# Patient Record
Sex: Male | Born: 1935
Health system: Southern US, Community
[De-identification: ages and names within clinical notes are randomized; demographics above are authoritative.]

## PROBLEM LIST (undated history)

## (undated) DIAGNOSIS — T7840XA Allergy, unspecified, initial encounter: Secondary | ICD-10-CM

## (undated) DIAGNOSIS — K648 Other hemorrhoids: Secondary | ICD-10-CM

## (undated) DIAGNOSIS — E663 Overweight: Secondary | ICD-10-CM

## (undated) DIAGNOSIS — M5030 Other cervical disc degeneration, unspecified cervical region: Secondary | ICD-10-CM

## (undated) DIAGNOSIS — N281 Cyst of kidney, acquired: Secondary | ICD-10-CM

## (undated) DIAGNOSIS — K8020 Calculus of gallbladder without cholecystitis without obstruction: Secondary | ICD-10-CM

## (undated) DIAGNOSIS — Z8612 Personal history of poliomyelitis: Secondary | ICD-10-CM

## (undated) DIAGNOSIS — M5137 Other intervertebral disc degeneration, lumbosacral region: Secondary | ICD-10-CM

## (undated) DIAGNOSIS — Z86010 Personal history of colonic polyps: Secondary | ICD-10-CM

## (undated) DIAGNOSIS — I10 Essential (primary) hypertension: Secondary | ICD-10-CM

## (undated) DIAGNOSIS — Z87898 Personal history of other specified conditions: Secondary | ICD-10-CM

## (undated) HISTORY — DX: Cyst of kidney, acquired: N28.1

## (undated) HISTORY — DX: Essential (primary) hypertension: I10

## (undated) HISTORY — DX: Other hemorrhoids: K64.8

## (undated) HISTORY — DX: Overweight: E66.3

## (undated) HISTORY — DX: Other cervical disc degeneration, unspecified cervical region: M50.30

## (undated) HISTORY — DX: Other intervertebral disc degeneration, lumbosacral region: M51.37

## (undated) HISTORY — DX: Calculus of gallbladder without cholecystitis without obstruction: K80.20

## (undated) HISTORY — DX: Personal history of colonic polyps: Z86.010

## (undated) HISTORY — PX: OTHER SURGICAL HISTORY: SHX169

## (undated) HISTORY — DX: Personal history of poliomyelitis: Z86.12

## (undated) HISTORY — PX: TONSILLECTOMY: SUR1361

## (undated) HISTORY — DX: Personal history of other specified conditions: Z87.898

## (undated) HISTORY — DX: Allergy, unspecified, initial encounter: T78.40XA

## (undated) HISTORY — PX: HEMORRHOID SURGERY: SHX153

## (undated) HISTORY — PX: CERVICAL FUSION: SHX112

---

## 2001-11-18 ENCOUNTER — Encounter: Payer: Self-pay | Admitting: Emergency Medicine

## 2001-11-18 ENCOUNTER — Inpatient Hospital Stay (HOSPITAL_COMMUNITY): Admission: AC | Admit: 2001-11-18 | Discharge: 2001-11-19 | Payer: Self-pay

## 2002-04-22 ENCOUNTER — Inpatient Hospital Stay (HOSPITAL_COMMUNITY): Admission: RE | Admit: 2002-04-22 | Discharge: 2002-04-24 | Payer: Self-pay | Admitting: Neurosurgery

## 2002-04-22 ENCOUNTER — Encounter: Payer: Self-pay | Admitting: Neurosurgery

## 2003-06-15 ENCOUNTER — Encounter (INDEPENDENT_AMBULATORY_CARE_PROVIDER_SITE_OTHER): Payer: Self-pay | Admitting: Gastroenterology

## 2004-11-19 ENCOUNTER — Emergency Department (HOSPITAL_COMMUNITY): Admission: EM | Admit: 2004-11-19 | Discharge: 2004-11-19 | Payer: Self-pay | Admitting: Emergency Medicine

## 2005-01-08 ENCOUNTER — Ambulatory Visit (HOSPITAL_BASED_OUTPATIENT_CLINIC_OR_DEPARTMENT_OTHER): Admission: RE | Admit: 2005-01-08 | Discharge: 2005-01-08 | Payer: Self-pay | Admitting: Orthopedic Surgery

## 2005-01-08 ENCOUNTER — Ambulatory Visit (HOSPITAL_COMMUNITY): Admission: RE | Admit: 2005-01-08 | Discharge: 2005-01-08 | Payer: Self-pay | Admitting: Orthopedic Surgery

## 2006-01-20 ENCOUNTER — Emergency Department (HOSPITAL_COMMUNITY): Admission: EM | Admit: 2006-01-20 | Discharge: 2006-01-20 | Payer: Self-pay | Admitting: Emergency Medicine

## 2006-06-19 ENCOUNTER — Encounter: Admission: RE | Admit: 2006-06-19 | Discharge: 2006-06-19 | Payer: Self-pay | Admitting: Neurosurgery

## 2006-06-25 ENCOUNTER — Ambulatory Visit (HOSPITAL_COMMUNITY): Admission: RE | Admit: 2006-06-25 | Discharge: 2006-06-25 | Payer: Self-pay | Admitting: Neurosurgery

## 2006-07-25 ENCOUNTER — Inpatient Hospital Stay (HOSPITAL_COMMUNITY): Admission: RE | Admit: 2006-07-25 | Discharge: 2006-07-26 | Payer: Self-pay | Admitting: Neurosurgery

## 2006-09-23 ENCOUNTER — Ambulatory Visit: Payer: Self-pay | Admitting: Internal Medicine

## 2006-09-24 LAB — CONVERTED CEMR LAB
AST: 30 units/L (ref 0–37)
Albumin: 3.6 g/dL (ref 3.5–5.2)
BUN: 20 mg/dL (ref 6–23)
CO2: 32 meq/L (ref 19–32)
Calcium: 9.7 mg/dL (ref 8.4–10.5)
Cholesterol: 207 mg/dL (ref 0–200)
Creatinine, Ser: 1.3 mg/dL (ref 0.4–1.5)
Direct LDL: 119.5 mg/dL
GFR calc Af Amer: 70 mL/min
Glucose, Bld: 103 mg/dL — ABNORMAL HIGH (ref 70–99)
PSA: 1.01 ng/mL (ref 0.10–4.00)
Potassium: 3.5 meq/L (ref 3.5–5.1)
Sodium: 145 meq/L (ref 135–145)
Total Bilirubin: 1.1 mg/dL (ref 0.3–1.2)
Triglycerides: 71 mg/dL (ref 0–149)

## 2007-03-17 ENCOUNTER — Inpatient Hospital Stay (HOSPITAL_COMMUNITY): Admission: RE | Admit: 2007-03-17 | Discharge: 2007-03-19 | Payer: Self-pay | Admitting: Neurosurgery

## 2007-07-21 ENCOUNTER — Encounter: Admission: RE | Admit: 2007-07-21 | Discharge: 2007-07-21 | Payer: Self-pay | Admitting: Neurosurgery

## 2007-11-29 ENCOUNTER — Observation Stay (HOSPITAL_COMMUNITY): Admission: EM | Admit: 2007-11-29 | Discharge: 2007-11-30 | Payer: Self-pay | Admitting: Emergency Medicine

## 2007-11-29 ENCOUNTER — Ambulatory Visit: Payer: Self-pay | Admitting: *Deleted

## 2007-12-02 ENCOUNTER — Ambulatory Visit: Payer: Self-pay | Admitting: Cardiology

## 2007-12-02 ENCOUNTER — Encounter: Payer: Self-pay | Admitting: Internal Medicine

## 2007-12-02 ENCOUNTER — Ambulatory Visit: Payer: Self-pay

## 2007-12-02 LAB — CONVERTED CEMR LAB
GFR calc Af Amer: 77 mL/min
Glucose, Bld: 98 mg/dL (ref 70–99)

## 2007-12-03 ENCOUNTER — Ambulatory Visit (HOSPITAL_COMMUNITY): Admission: RE | Admit: 2007-12-03 | Discharge: 2007-12-03 | Payer: Self-pay | Admitting: Cardiology

## 2007-12-16 ENCOUNTER — Ambulatory Visit: Payer: Self-pay | Admitting: Cardiology

## 2007-12-17 DIAGNOSIS — Z87898 Personal history of other specified conditions: Secondary | ICD-10-CM

## 2007-12-17 DIAGNOSIS — N281 Cyst of kidney, acquired: Secondary | ICD-10-CM

## 2007-12-17 DIAGNOSIS — Z860100 Personal history of colon polyps, unspecified: Secondary | ICD-10-CM

## 2007-12-17 DIAGNOSIS — I10 Essential (primary) hypertension: Secondary | ICD-10-CM | POA: Insufficient documentation

## 2007-12-17 DIAGNOSIS — Z8612 Personal history of poliomyelitis: Secondary | ICD-10-CM | POA: Insufficient documentation

## 2007-12-17 DIAGNOSIS — K8020 Calculus of gallbladder without cholecystitis without obstruction: Secondary | ICD-10-CM | POA: Insufficient documentation

## 2007-12-17 DIAGNOSIS — M5137 Other intervertebral disc degeneration, lumbosacral region: Secondary | ICD-10-CM

## 2007-12-17 DIAGNOSIS — K648 Other hemorrhoids: Secondary | ICD-10-CM | POA: Insufficient documentation

## 2007-12-17 DIAGNOSIS — M51379 Other intervertebral disc degeneration, lumbosacral region without mention of lumbar back pain or lower extremity pain: Secondary | ICD-10-CM

## 2007-12-17 HISTORY — DX: Other hemorrhoids: K64.8

## 2007-12-17 HISTORY — DX: Personal history of other specified conditions: Z87.898

## 2007-12-17 HISTORY — DX: Cyst of kidney, acquired: N28.1

## 2007-12-17 HISTORY — DX: Other intervertebral disc degeneration, lumbosacral region without mention of lumbar back pain or lower extremity pain: M51.379

## 2007-12-17 HISTORY — DX: Personal history of colon polyps, unspecified: Z86.0100

## 2007-12-17 HISTORY — DX: Calculus of gallbladder without cholecystitis without obstruction: K80.20

## 2007-12-17 HISTORY — DX: Other intervertebral disc degeneration, lumbosacral region: M51.37

## 2007-12-17 HISTORY — DX: Essential (primary) hypertension: I10

## 2007-12-17 HISTORY — DX: Personal history of colonic polyps: Z86.010

## 2007-12-17 HISTORY — DX: Personal history of poliomyelitis: Z86.12

## 2007-12-19 ENCOUNTER — Ambulatory Visit: Payer: Self-pay | Admitting: Internal Medicine

## 2008-01-05 ENCOUNTER — Emergency Department (HOSPITAL_COMMUNITY): Admission: EM | Admit: 2008-01-05 | Discharge: 2008-01-06 | Payer: Self-pay | Admitting: Emergency Medicine

## 2008-01-11 ENCOUNTER — Emergency Department (HOSPITAL_COMMUNITY): Admission: EM | Admit: 2008-01-11 | Discharge: 2008-01-11 | Payer: Self-pay | Admitting: Emergency Medicine

## 2008-01-26 ENCOUNTER — Telehealth: Payer: Self-pay | Admitting: Internal Medicine

## 2008-02-18 ENCOUNTER — Ambulatory Visit: Payer: Self-pay | Admitting: Internal Medicine

## 2008-02-19 DIAGNOSIS — E663 Overweight: Secondary | ICD-10-CM

## 2008-02-19 DIAGNOSIS — M5030 Other cervical disc degeneration, unspecified cervical region: Secondary | ICD-10-CM | POA: Insufficient documentation

## 2008-02-19 HISTORY — DX: Other cervical disc degeneration, unspecified cervical region: M50.30

## 2008-02-19 HISTORY — DX: Overweight: E66.3

## 2008-05-13 ENCOUNTER — Encounter (INDEPENDENT_AMBULATORY_CARE_PROVIDER_SITE_OTHER): Payer: Self-pay | Admitting: *Deleted

## 2008-05-13 ENCOUNTER — Telehealth (INDEPENDENT_AMBULATORY_CARE_PROVIDER_SITE_OTHER): Payer: Self-pay | Admitting: *Deleted

## 2008-05-18 ENCOUNTER — Encounter: Admission: RE | Admit: 2008-05-18 | Discharge: 2008-05-18 | Payer: Self-pay | Admitting: Neurosurgery

## 2008-10-29 ENCOUNTER — Encounter: Payer: Self-pay | Admitting: Internal Medicine

## 2008-11-01 IMAGING — CR DG LUMBAR SPINE 1V
1 series · 1 of 1 positions shown · non-contrast
Comparison: 07/25/06.

CLINICAL DATA: Lumbar stenosis ? localization for decompression.  
 LUMBAR SPINE ? 1 VIEW:

[view not recorded]
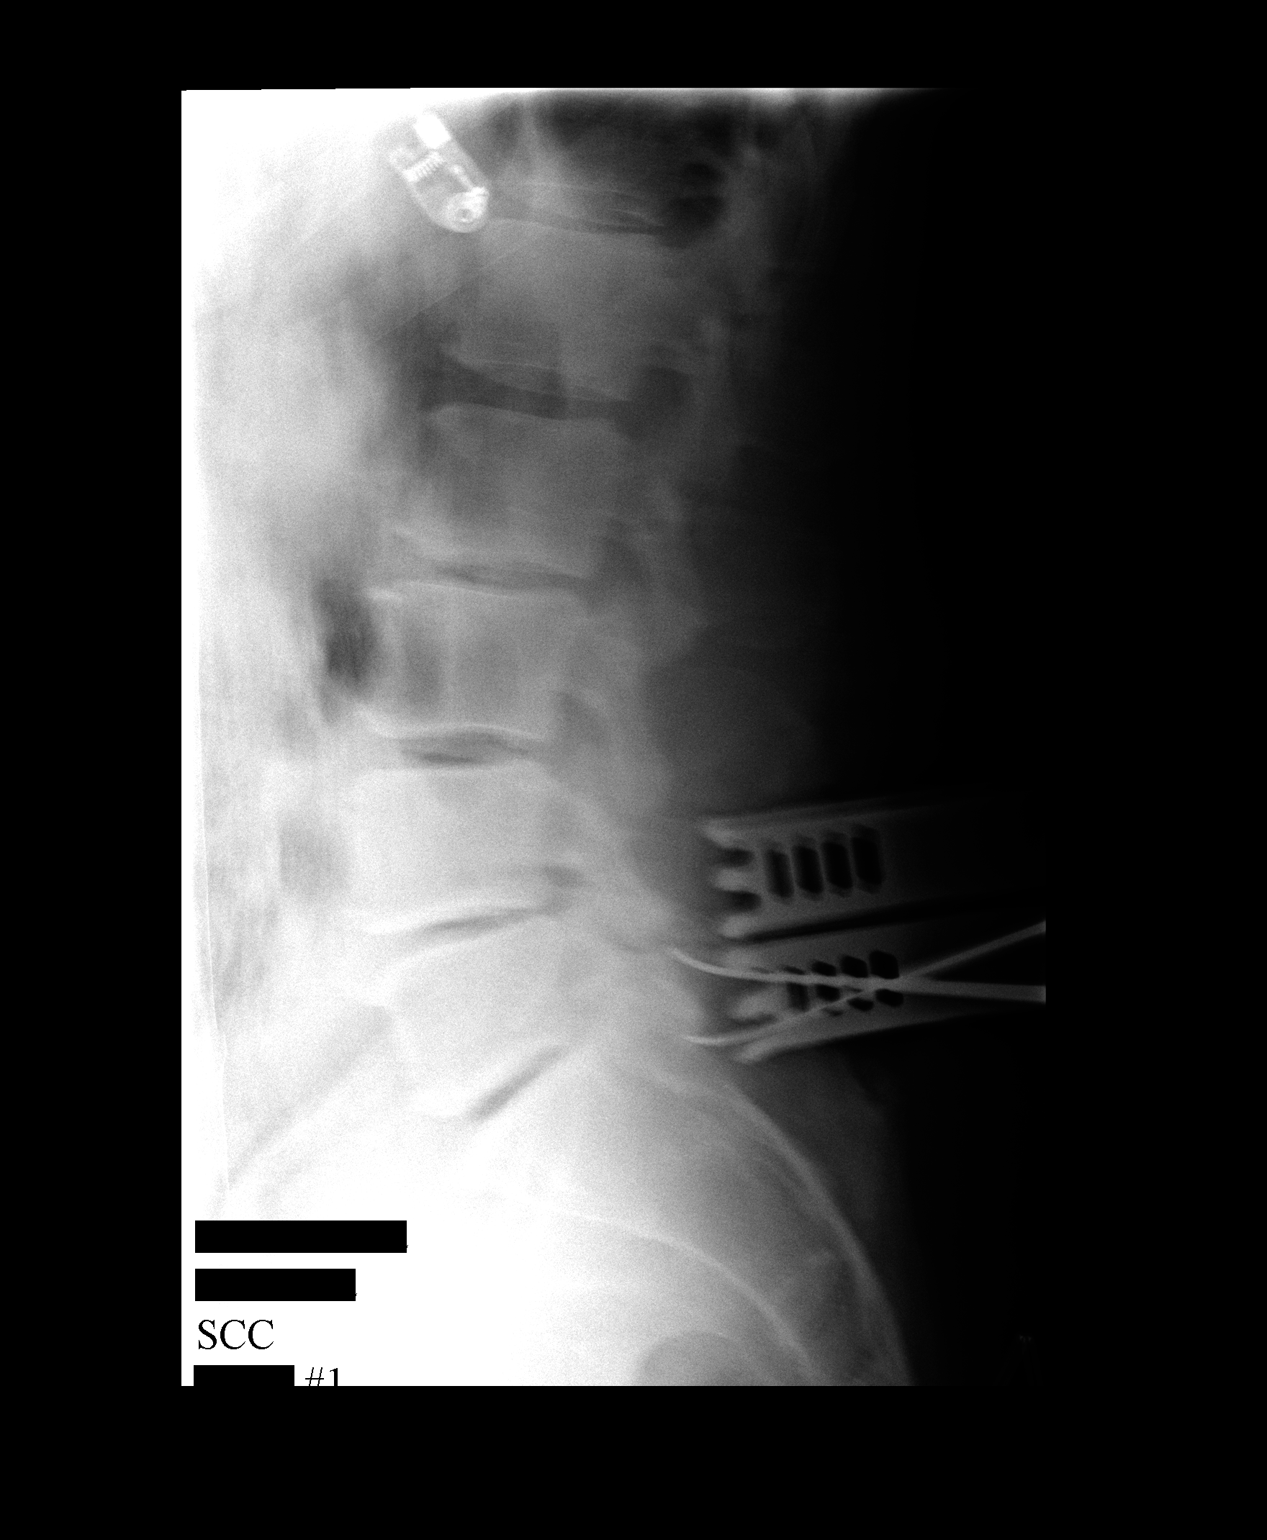

[1 of 1 positions shown; findings below may reference images not displayed]

FINDINGS: Lateral view from the operating room shows instruments posterior to the neural canal at the level of the L5-S1 disc space, and at the level of the L5 pedicles.  There is disc narrowing at L4-5 and L5-S1.
IMPRESSION: Spinal localization as above.

## 2008-11-01 IMAGING — RF DG LUMBAR SPINE 2-3V
1 series · 2 of 2 positions shown · non-contrast
Comparison: none

CLINICAL DATA: 71-year-old with lumbar stenosis.  Decompressive laminectomy and fusion. 
 INTRAOPERATIVE SPOT VIEWS OF LUMBAR SPINE ? 2 VIEW:

[Series 1: run · 2 of 2 slices shown]
[im 1/2]
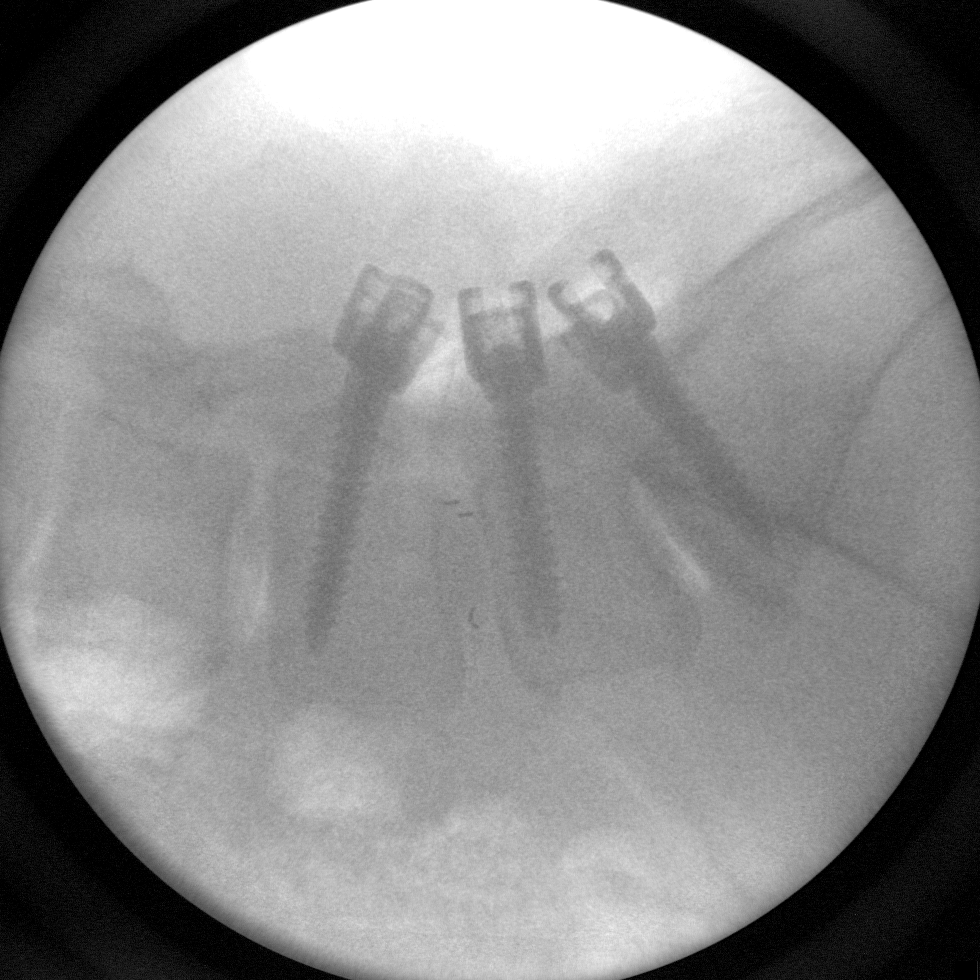
[im 2/2]
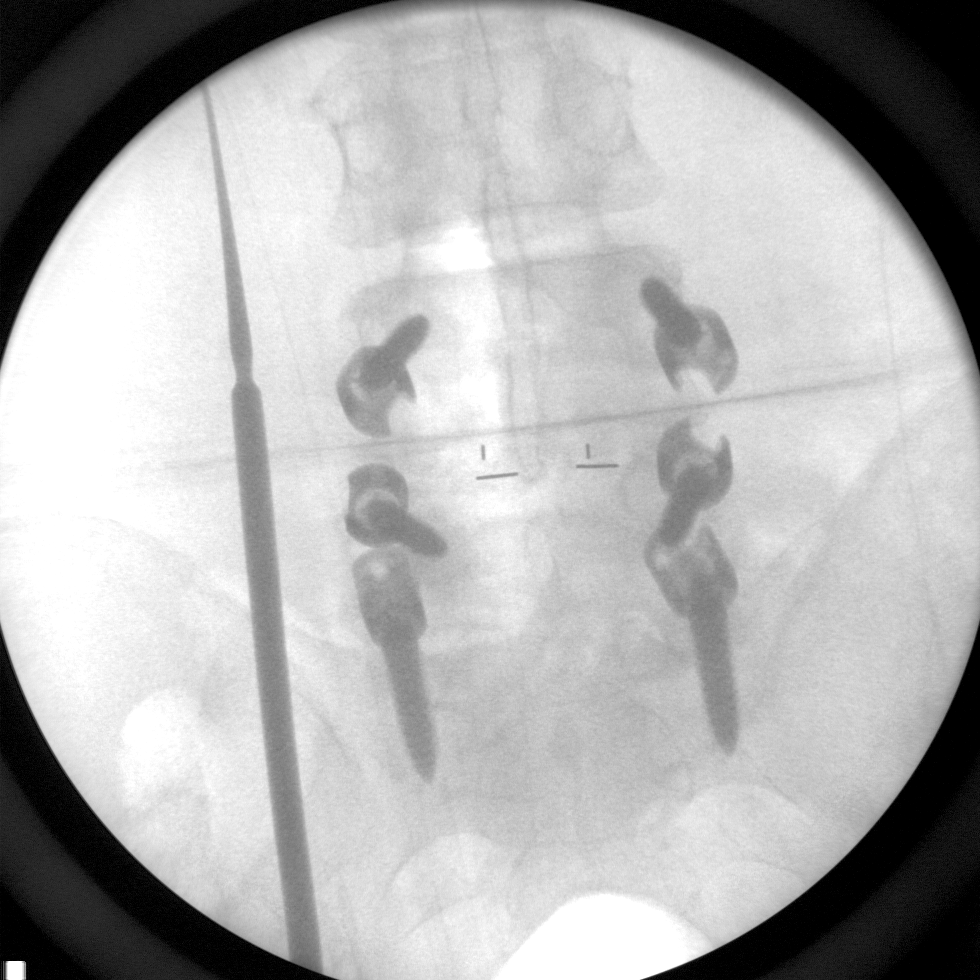

[2 of 2 positions shown; findings below may reference images not displayed]

FINDINGS: Pedicle screws are in place along with an interbody bone spacer at L4-5.  Alignment is normal.  No complicating features demonstrated.
IMPRESSION: Pedicle screws in L4, L5, and S1 along with interbody bone spacer at L4-5.

## 2008-12-25 ENCOUNTER — Encounter: Admission: RE | Admit: 2008-12-25 | Discharge: 2008-12-25 | Payer: Self-pay | Admitting: Neurosurgery

## 2009-03-07 ENCOUNTER — Ambulatory Visit: Payer: Self-pay | Admitting: Internal Medicine

## 2009-03-07 LAB — CONVERTED CEMR LAB
ALT: 38 units/L (ref 0–53)
AST: 33 units/L (ref 0–37)
Alkaline Phosphatase: 73 units/L (ref 39–117)
Basophils Absolute: 0.1 10*3/uL (ref 0.0–0.1)
Basophils Relative: 0.8 % (ref 0.0–3.0)
CO2: 34 meq/L — ABNORMAL HIGH (ref 19–32)
Creatinine, Ser: 1.2 mg/dL (ref 0.4–1.5)
Eosinophils Absolute: 0.1 10*3/uL (ref 0.0–0.7)
Lymphs Abs: 1.8 10*3/uL (ref 0.7–4.0)
MCV: 96.8 fL (ref 78.0–100.0)
Monocytes Absolute: 0.6 10*3/uL (ref 0.1–1.0)
Monocytes Relative: 7 % (ref 3.0–12.0)
Neutro Abs: 5.7 10*3/uL (ref 1.4–7.7)
Neutrophils Relative %: 69.1 % (ref 43.0–77.0)
Potassium: 3.3 meq/L — ABNORMAL LOW (ref 3.5–5.1)
RBC: 4.88 M/uL (ref 4.22–5.81)
RDW: 12.4 % (ref 11.5–14.6)
Total Bilirubin: 1.2 mg/dL (ref 0.3–1.2)
Total CHOL/HDL Ratio: 4
Total Protein: 8.1 g/dL (ref 6.0–8.3)
VLDL: 14.6 mg/dL (ref 0.0–40.0)

## 2009-03-09 ENCOUNTER — Encounter: Payer: Self-pay | Admitting: Internal Medicine

## 2009-03-10 ENCOUNTER — Telehealth: Payer: Self-pay | Admitting: Internal Medicine

## 2009-05-23 ENCOUNTER — Encounter (INDEPENDENT_AMBULATORY_CARE_PROVIDER_SITE_OTHER): Payer: Self-pay | Admitting: *Deleted

## 2009-05-24 ENCOUNTER — Ambulatory Visit: Payer: Self-pay | Admitting: Internal Medicine

## 2009-06-07 ENCOUNTER — Ambulatory Visit: Payer: Self-pay | Admitting: Internal Medicine

## 2009-06-14 ENCOUNTER — Encounter: Payer: Self-pay | Admitting: Internal Medicine

## 2009-08-22 IMAGING — CR DG CHEST 2V
2 series · 2 of 2 positions shown · non-contrast
Comparison: Portable chest 11/29/2007.

CLINICAL DATA: Fall, head injury

CHEST - 2 VIEW

[w chest pa]
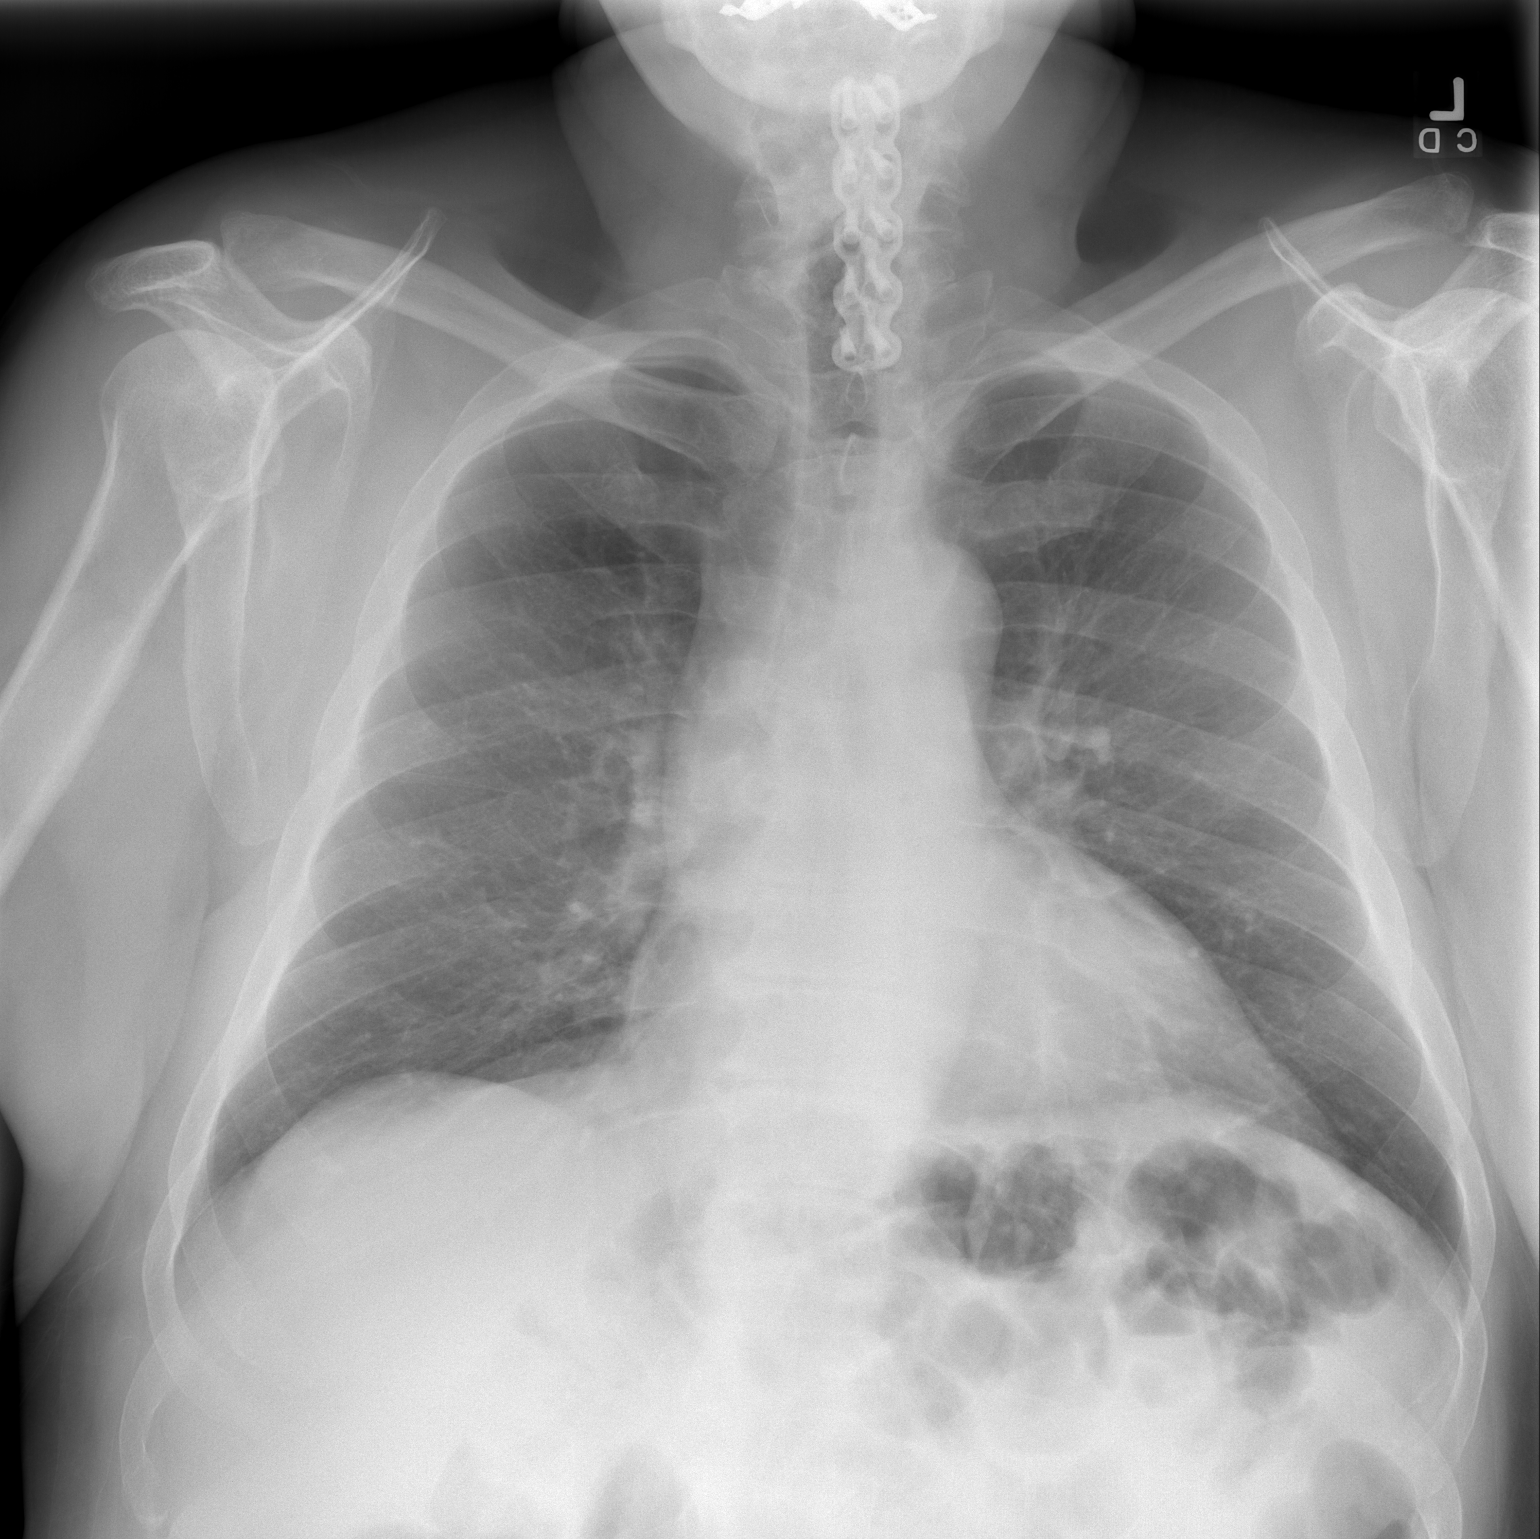

[w chest lat]
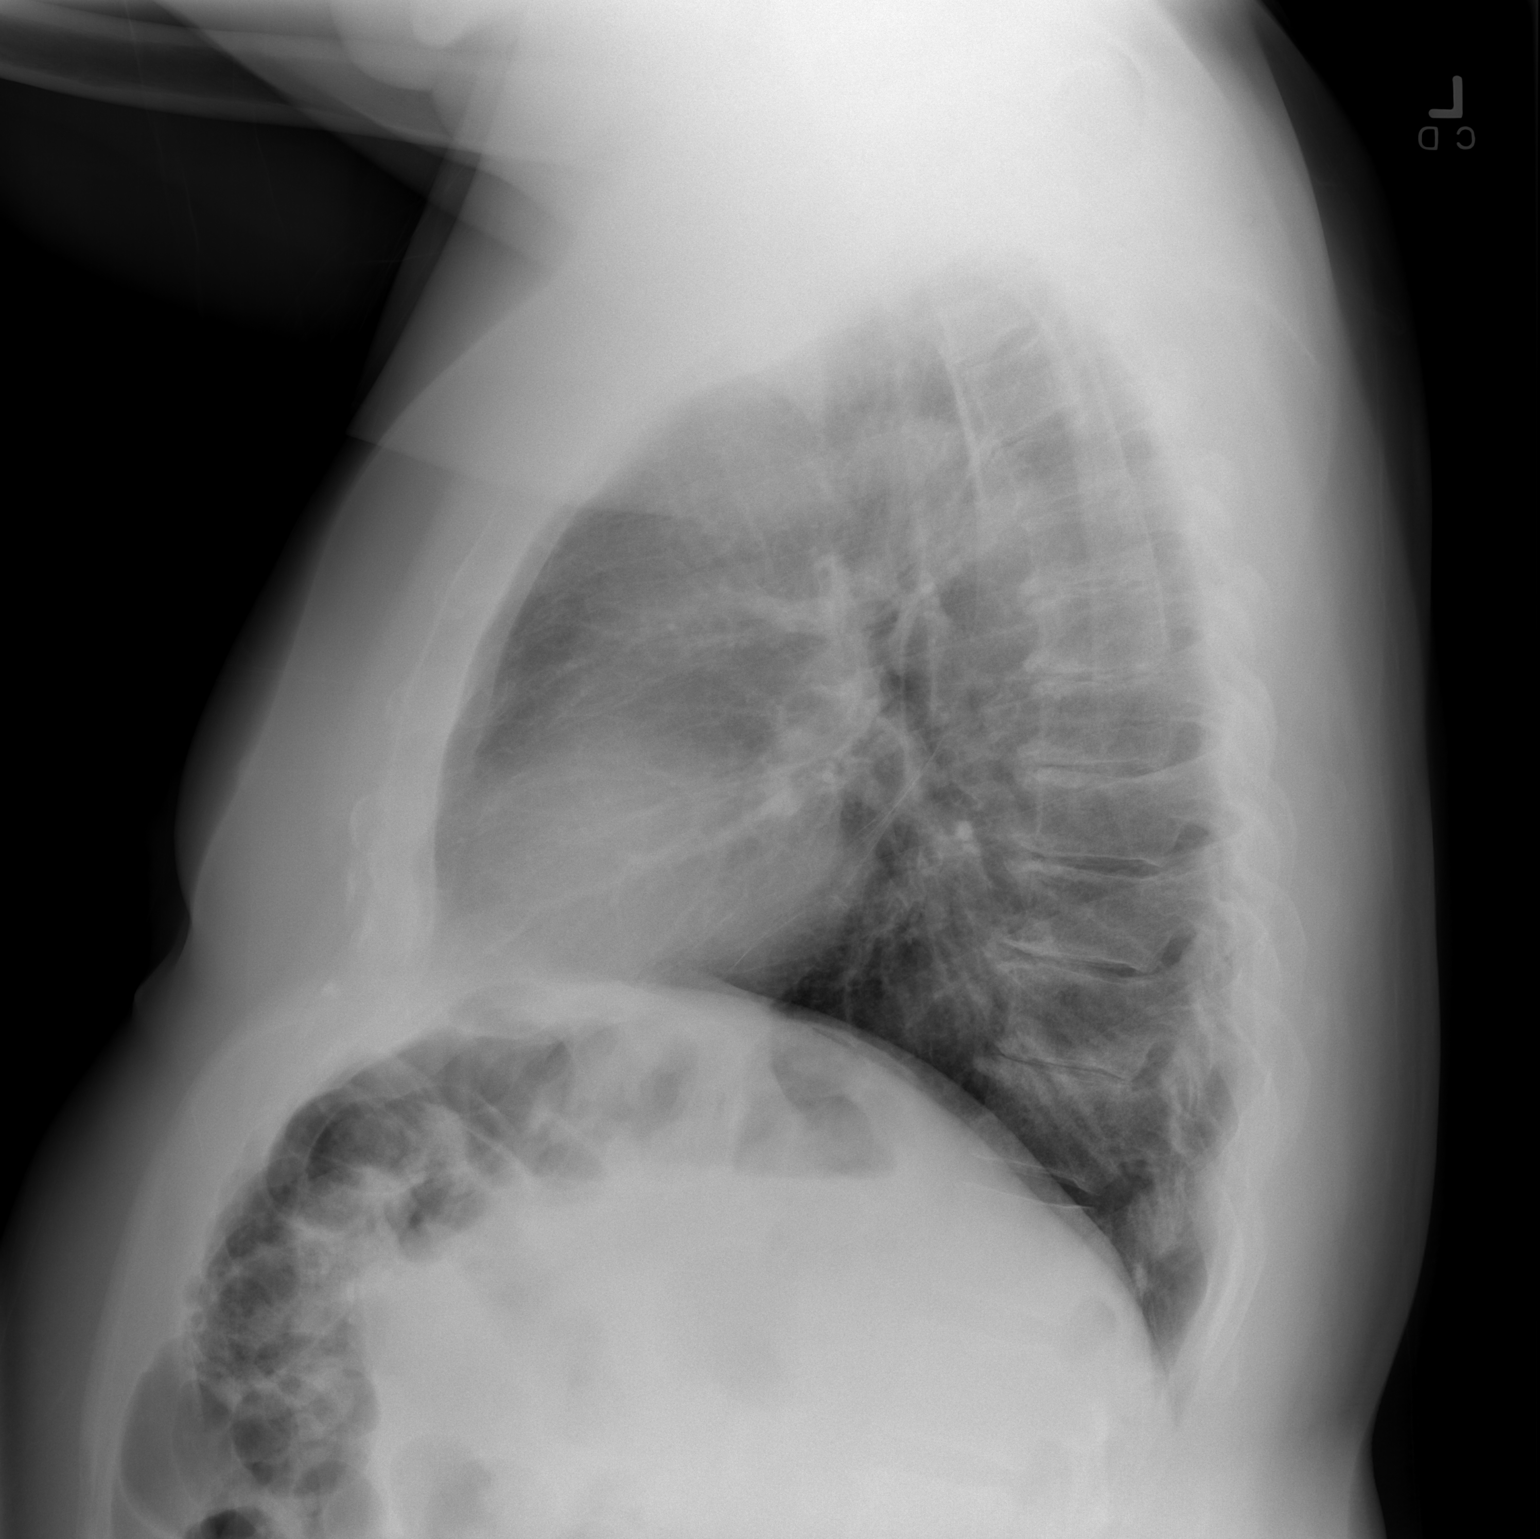

[2 of 2 positions shown; findings below may reference images not displayed]

FINDINGS: Lungs are clear.  There is no pleural effusion or
pneumothorax.  Heart size is normal.  No focal bony abnormality.
IMPRESSION: No acute disease.

## 2009-10-24 ENCOUNTER — Telehealth: Payer: Self-pay | Admitting: Internal Medicine

## 2010-02-01 ENCOUNTER — Encounter: Payer: Self-pay | Admitting: Internal Medicine

## 2010-02-01 ENCOUNTER — Ambulatory Visit: Payer: Self-pay | Admitting: Internal Medicine

## 2010-02-01 LAB — CONVERTED CEMR LAB
Basophils Relative: 0.7 % (ref 0.0–3.0)
Calcium: 9.6 mg/dL (ref 8.4–10.5)
Creatinine, Ser: 1 mg/dL (ref 0.4–1.5)
GFR calc non Af Amer: 75.33 mL/min (ref >60)
Hemoglobin: 14.8 g/dL (ref 13.0–17.0)
MCHC: 34.7 g/dL (ref 30.0–36.0)
Neutro Abs: 2.6 10*3/uL (ref 1.4–7.7)
Neutrophils Relative %: 51.7 % (ref 43.0–77.0)
Potassium: 3.9 meq/L (ref 3.5–5.1)
RBC: 4.45 M/uL (ref 4.22–5.81)
RDW: 12.6 % (ref 11.5–14.6)
Sodium: 142 meq/L (ref 135–145)
TSH: 1.2 microintl units/mL (ref 0.35–5.50)
VLDL: 16.6 mg/dL (ref 0.0–40.0)
WBC: 5.1 10*3/uL (ref 4.5–10.5)

## 2010-03-15 ENCOUNTER — Ambulatory Visit: Payer: Self-pay | Admitting: Internal Medicine

## 2010-04-25 ENCOUNTER — Telehealth: Payer: Self-pay | Admitting: Internal Medicine

## 2010-05-02 NOTE — Assessment & Plan Note (Signed)
Summary: YEARLY  MEDICARE FU [name]  #   Vital Signs:  Patient profile:   75 year old male Height:      70 inches Weight:      193 pounds BMI:     27.79 O2 Sat:      97 % on Room air Temp:     98 degrees F oral Pulse rate:   62 / minute BP sitting:   120 / 70  (left arm) Cuff size:   large  Vitals Entered By: Ami Bullins CMA (February 01, 2010 10:17 AM)  O2 Flow:  Room air CC: yearly/ ab  Does patient need assistance? Ambulation Normal Comments pt not taking oxycontin  Vision Screening:      Vision Comments: Normal eye exam Nov 2010, with Dr Nelle Don   Primary Care Provider:  [name], MD  CC:  yearly/ ab.  History of Present Illness: Patient reports that he saw Dr. Laurence Aly for regenerative medicine ([email or web address]) and had his own bloood/serum injected into Laminectomy scar tissue with resolution of morning stiffness. He also had a back injection. He is feeling better.   He is otherwise doing well without any main problems. He is no longer taking oxycontin. He does take diphenhydramine 50mg  at bedtime.  He remains fully independent in all ADLs. 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 He has had no falls and has no fall risk. He does exercise on a regular basis. He has no signs or symptoms depressions.  Preventive Screening-Counseling & Management  Alcohol-Tobacco     Alcohol drinks/day: 0     Smoking Status: quit     Year Quit: 2007  Caffeine-Diet-Exercise     Caffeine use/day: 2 cups per day     Diet Comments: heart healthy diet     Does Patient Exercise: yes     Type of exercise: swim     Exercise (avg: min/session): 30-60     Times/week: 7  Hep-HIV-STD-Contraception     Hepatitis Risk: no risk noted     HIV Risk: no risk noted     STD Risk: no risk noted     Dental Visit-last 6 months yes     Sun Exposure-Excessive: no  Safety-Violence-Falls     Seat Belt Use: yes     Helmet Use:  n/a     Firearms in the Home: firearms in the home     Smoke Detectors: yes     Violence in the Home: no risk noted     Sexual Abuse: no     Fall Risk: low fall risk      Sexual History:  currently monogamous.        Drug Use:  never.        Blood Transfusions:  no.    Current Medications (verified): 1)  Hydrochlorothiazide 25 Mg  Tabs (Hydrochlorothiazide) .... Take 1 Tablet By Mouth Once A Day 2)  Aspirin 81 Mg Tbec (Aspirin) .... Take 1 Tablet By Mouth Once A Day 3)  Multivitamins  Tabs (Multiple Vitamin) .... Take 1 Tablet By Mouth Once A Day 4)  Klor-Con M20 20 Meq Cr-Tabs (Potassium Chloride Crys Cr) .... Once Daily 5)  Diazepam 5 Mg Tabs (Diazepam) .Marland Kitchen.. 1 By Mouth At Bedtime As Needed 6)  Benadryl Allergy 25 Mg Tabs (Diphenhydramine Hcl) .... 2 At Bedtime  Allergies (verified): No Known Drug Allergies  Past History:  Past Medical History: Last updated: 02/18/2008 CHEST PAIN,  NON-CARDIAC (ICD-786.59) Sept '09 CHOLELITHIASIS [phone]) Sept '09 RENAL CYST (ICD-593.2) INTERNAL HEMORRHOIDS (ICD-455.0) COLONIC POLYPS, ADENOMATOUS, HX OF (ICD-V12.72) POLIOMYELITIS, HX OF (ICD-V12.02) SHINGLES, HX OF (ICD-V13.8) DISC DISEASE, LUMBAR (ICD-722.52) Dec '08 HYPERTENSION (ICD-401.9)  Past Surgical History: Last updated: 12/17/2007 hemorrhoidectomy cervical fusion Bilateral knee surgery lumbar back surgery tonsillectomy  Social History: Last updated: 02/18/2008 HSG Married '64 2 adopted sons: one in business with him, one an Conservator, museum/gallery; 1 grandchild Work:  CEO-company supplies and Games developer. Moved to [place] '64.  Marriage in good health.  Family History: father - deceased @ 43: myasthenia gravis mother - deceased @ 55: unkonwn actual cause of death Neg- CAD, prostate or colon cancer, DM PGF - CAD  Social History: Caffeine use/day:  2 cups per day Dental Care w/in 6 mos.:  yes Sun Exposure-Excessive:  no Seat Belt Use:   yes Fall Risk:  low fall risk Hepatitis Risk:  no risk noted HIV Risk:  no risk noted STD Risk:  no risk noted Sexual History:  currently monogamous Drug Use:  never Blood Transfusions:  no  Review of Systems  The patient denies anorexia, fever, weight loss, weight gain, decreased hearing, hoarseness, peripheral edema, prolonged cough, hemoptysis, abdominal pain, severe indigestion/heartburn, incontinence, difficulty walking, depression, unusual weight change, enlarged lymph nodes, and angioedema.    Physical Exam  General:  alert, well-developed, well-nourished, well-hydrated, and normal appearance.   Head:  normocephalic, atraumatic, and no abnormalities observed.   Eyes:  vision grossly intact, pupils equal, pupils round, corneas and lenses clear, and no optic disk abnormalities.   Ears:  R ear normal and L ear normal.   Nose:  no external deformity and no external erythema.   Mouth:  Oral mucosa and oropharynx without lesions or exudates.  Teeth in good repair. Neck:  supple, full ROM, no thyromegaly, and no carotid bruits.   Chest Wall:  no deformities and no tenderness.   Lungs:  normal respiratory effort, normal breath sounds, no crackles, and no wheezes.   Heart:  normal rate, regular rhythm, no murmur, and no gallop.   Abdomen:  soft, non-tender, normal bowel sounds, no masses, no guarding, no rigidity, and no hepatomegaly.   Rectal:  No external abnormalities noted. Normal sphincter tone. No rectal masses or tenderness. Prostate:  Prostate gland firm and smooth, no enlargement, nodularity, tenderness, mass, asymmetry or induration. Msk:  normal ROM, no joint tenderness, no joint swelling, no joint warmth, and no joint deformities.   Pulses:  2+ radial and DP pulses Extremities:  No clubbing, cyanosis, edema, or deformity noted with normal full range of motion of all joints.   Neurologic:  alert & oriented X3, cranial nerves II-XII intact, strength normal in all extremities,  gait normal, and DTRs symmetrical and normal.   Skin:  turgor normal, color normal, no rashes, and no ulcerations.   Cervical Nodes:  no anterior cervical adenopathy and no posterior cervical adenopathy.   Inguinal Nodes:  no R inguinal adenopathy and no L inguinal adenopathy.   Psych:  Oriented X3, memory intact for recent and remote, normally interactive, and good eye contact.     Impression & Recommendations:  Problem # 1:  OVERWEIGHT (ICD-278.02) He has done a marvelous job of weight management with 12 lbs weight loss in the last year.   Plan - continue prudent diet and exercise  Problem # 2:  Hx of DEGENERATIVE DISC DISEASE, CERVICAL SPINE (ICD-722.4) Remarkable improvement n symptoms following regenerative medicine consult and treatment.  Problem # 3:  CHOLELITHIASIS (ICD-574.20) Stable, although he does have occasional biliary colic, especially if he consumes a high fat content meal.   Problem # 4:  INTERNAL HEMORRHOIDS (ICD-455.0) No recent active bleed. Will check CBC to r/o anemia related to this problem.  Orders: TLB-CBC Platelet - w/Differential (85025-CBCD)  Addendum - CBC normal  Problem # 5:  HYPERTENSION (ICD-401.9)  His updated medication list for this problem includes:    Hydrochlorothiazide 25 Mg Tabs (Hydrochlorothiazide) .Marland Kitchen... Take 1 tablet by mouth once a day  BP today: 120/70 Prior BP: 122/84 (03/07/2009)  Great control on minimal medication.  Orders: TLB-BMP (Basic Metabolic Panel-BMET) (80048-METABOL)  Problem # 6:  NONSPEC ELEVATION OF LEVELS OF TRANSAMINASE/LDH (ICD-790.4) Lab does reveal mild elevation of liver functions which may be related to gallstones or mild viral infection.                          02/01/10            03/06/09         12/02/07       09/24/06     SGPT              46                    33                26             28     SGOT             80                    38                39             30  Plan - recheck labs in 6  weeks.  Problem # 7:  Preventive Health Care (ICD-V70.0) Interval history notable for the treatment he describes as regeneraative medicine - which has helped to relieve some chronic back pain. His physical exam is normal. Lab results, including PSA  and lipid profile are within normal range except for minimal elevation in liver functions. He is current with colorectal cancer screening with last colonoscopy March '11. He is current with prostate cancer screening. Immunizations: Tetnus Feb '08, Flu vaccine at outside pharmacy this year, Pneumovax today. He has had shingles. 12 Lead EKG is negative for active ischemia.  Patient is independent in ADLs, has normal cognitive function, is not depressed and has no fall risk. He is counseled to have repeat liver functions in 6 weeks. He is counseled to continue weight management program of healthy diet and exercise.   In summary - a very nice man who appears to be medically stable at this time. He will return as needed or in 1 year. He will return for follow-up lab, e.g. hepatic panel, in 6 weeks.  Orders: Medicare -1st Annual Wellness Visit [phone]) TLB-Lipid Panel (80061-LIPID) TLB-Hepatic/Liver Function Pnl (80076-HEPATIC) TLB-TSH (Thyroid Stimulating Hormone) (84443-TSH)  Complete Medication List: 1)  Hydrochlorothiazide 25 Mg Tabs (Hydrochlorothiazide) .... Take 1 tablet by mouth once a day 2)  Aspirin 81 Mg Tbec (Aspirin) .... Take 1 tablet by mouth once a day 3)  Multivitamins Tabs (Multiple vitamin) .... Take 1 tablet by mouth once a day 4)  Klor-con M20 20 Meq Cr-tabs (Potassium chloride crys  cr) .... Once daily 5)  Diazepam 5 Mg Tabs (Diazepam) .Marland Kitchen.. 1 by mouth at bedtime as needed 6)  Benadryl Allergy 25 Mg Tabs (Diphenhydramine hcl) .... 2 at bedtime  Other Orders: Pneumococcal Vaccine (11914) Admin 1st Vaccine (78295) EKG w/ Interpretation (93000) TLB-PSA (Prostate Specific Antigen) (84153-PSA)   ient: Decorian Dimmer Note: All result  statuses are Final unless otherwise noted.  Tests: (1) BMP (METABOL)   Sodium                    142 mEq/L                   135-145   Potassium                 3.9 mEq/L                   3.5-5.1   Chloride                  101 mEq/L                   96-112   Carbon Dioxide       [H]  34 mEq/L                    19-32   Glucose                   87 mg/dL                    62-13   BUN                       21 mg/dL                    0-86   Creatinine                1.0 mg/dL                   5.7-8.4   Calcium                   9.6 mg/dL                   6.9-62.9   GFR                       75.33 mL/min                >60  Tests: (2) CBC Platelet w/Diff (CBCD)   White Cell Count          5.1 K/uL                    4.5-10.5   Red Cell Count            4.45 Mil/uL                 4.22-5.81   Hemoglobin                14.8 g/dL                   52.8-41.3   Hematocrit                42.5 %  39.0-52.0   MCV                       95.6 fl                     78.0-100.0   MCHC                      34.7 g/dL                   16.1-09.6   RDW                       12.6 %                      11.5-14.6   Platelet Count            195.0 K/uL                  150.0-400.0   Neutrophil %              51.7 %                      43.0-77.0   Lymphocyte %              36.2 %                      12.0-46.0   Monocyte %                8.3 %                       3.0-12.0   Eosinophils%              3.1 %                       0.0-5.0   Basophils %               0.7 %                       0.0-3.0   Neutrophill Absolute      2.6 K/uL                    1.4-7.7   Lymphocyte Absolute       1.8 K/uL                    0.7-4.0   Monocyte Absolute         0.4 K/uL                    0.1-1.0  Eosinophils, Absolute                             0.2 K/uL                    0.0-0.7   Basophils Absolute        0.0 K/uL                    0.0-0.1  Tests: (3) Lipid Panel (LIPID)    Cholesterol               174 mg/dL  0-200     ATP III Classification            Desirable:  < 200 mg/dL                    Borderline High:  200 - 239 mg/dL               High:  > = 240 mg/dL   Triglycerides             83.0 mg/dL                  0.8-657.8     Normal:  <150 mg/dL     Borderline High:  469 - 199 mg/dL   HDL                       62.95 mg/dL                 >28.41   VLDL Cholesterol          16.6 mg/dL                  3.2-44.0   LDL Cholesterol      [H]  102 mg/dL                   7-25  CHO/HDL Ratio:  CHD Risk                             3                    Men          Women     1/2 Average Risk     3.4          3.3     Average Risk          5.0          4.4     2X Average Risk          9.6          7.1     3X Average Risk          15.0          11.0                           Tests: (4) Hepatic/Liver Function Panel (HEPATIC)   Total Bilirubin           1.0 mg/dL                   3.6-6.4   Direct Bilirubin          0.1 mg/dL                   4.0-3.4   Alkaline Phosphatase      76 U/L                      39-117   AST                  [H]  46 U/L                      0-37   ALT                  [  H]  80 U/L                      0-53   Total Protein             6.3 g/dL                    9.5-6.2   Albumin                   3.7 g/dL                    1.3-0.8  Tests: (5) TSH (TSH)   FastTSH                   1.20 uIU/mL                 0.35-5.50  Tests: (6) Prostate Specific Antigen (PSA)   PSA-Hyb                   1.26 ng/mL                  0.10-4.00  Orders Added: 1)  Pneumococcal Vaccine [90732] 2)  Admin 1st Vaccine [90471] 3)  Medicare -1st Annual Wellness Visit [G0438] 4)  Est. Patient Level IV [65784] 5)  EKG w/ Interpretation [93000] 6)  TLB-BMP (Basic Metabolic Panel-BMET) [80048-METABOL] 7)  TLB-CBC Platelet - w/Differential [85025-CBCD] 8)  TLB-Lipid Panel [80061-LIPID] 9)  TLB-Hepatic/Liver Function Pnl  [80076-HEPATIC] 10)  TLB-TSH (Thyroid Stimulating Hormone) [84443-TSH] 11)  TLB-PSA (Prostate Specific Antigen) [69629-BMW]   Immunizations Administered:  Pneumonia Vaccine:    Vaccine Type: Pneumovax    Site: left deltoid    Mfr: Merck    Dose: 0.5 ml    Route: IM    Given by: Ami Bullins CMA    Exp. Date: 07/28/2011    Lot #: [id number]    VIS given: 03/07/09 version given February 01, 2010.   Immunizations Administered:  Pneumonia Vaccine:    Vaccine Type: Pneumovax    Site: left deltoid    Mfr: Merck    Dose: 0.5 ml    Route: IM    Given by: Ami Bullins CMA    Exp. Date: 07/28/2011    Lot #: [id number]    VIS given: 03/07/09 version given February 01, 2010.

## 2010-05-02 NOTE — Letter (Signed)
Summary: [facility] Instructions  [facility]  [address] [place], [place] [address]   Phone: [phone]  Fax: [phone]       JALENE LACKO    12-15-1935    MRN: [id number]       Procedure Day /Date:  Tuesday 06/07/2009     Arrival Time: 9:00 am     Procedure Time: 10:00 am     Location of Procedure:                    _x _  [facility] (4th Floor)   PREPARATION FOR COLONOSCOPY WITH MIRALAX  Starting 5 days prior to your procedure Thursday 3/3 do not eat nuts, seeds, popcorn, corn, beans, peas,  salads, or any raw vegetables.  Do not take any fiber supplements (e.g. Metamucil, Citrucel, and Benefiber). ____________________________________________________________________________________________________   THE DAY BEFORE YOUR PROCEDURE         DATE: Monday 3/7  1   Drink clear liquids the entire day-NO SOLID FOOD  2   Do not drink anything colored red or purple.  Avoid juices with pulp.  No orange juice.  3   Drink at least 64 oz. (8 glasses) of fluid/clear liquids during the day to prevent dehydration and help the prep work efficiently.  CLEAR LIQUIDS INCLUDE: Water Jello Ice Popsicles Tea (sugar ok, no milk/cream) Powdered fruit flavored drinks Coffee (sugar ok, no milk/cream) Gatorade Juice: apple, white grape, white cranberry  Lemonade Clear bullion, consomm, broth Carbonated beverages (any kind) Strained chicken noodle soup Hard Candy  4   Mix the entire bottle of Miralax with 64 oz. of Gatorade/Powerade in the morning and put in the refrigerator to chill.  5   At 3:00 pm take 2 Dulcolax/Bisacodyl tablets.  6   At 4:30 pm take one Reglan/Metoclopramide tablet.  7  Starting at 5:00 pm drink one 8 oz glass of the Miralax mixture every 15-20 minutes until you have finished drinking the entire 64 oz.  You should finish drinking prep around 7:30 or 8:00 pm.  8   If you are nauseated, you may take the 2nd Reglan/Metoclopramide tablet  at 6:30 pm.        9    At 8:00 pm take 2 more DULCOLAX/Bisacodyl tablets.     THE DAY OF YOUR PROCEDURE      DATE:  Tuesday 3/8  You may drink clear liquids until 8:00 am   (2 HOURS BEFORE PROCEDURE).   MEDICATION INSTRUCTIONS  Unless otherwise instructed, you should take regular prescription medications with a small sip of water as early as possible the morning of your procedure.   Additional medication instructions: Hold HCTZ morning of procedure.         OTHER INSTRUCTIONS  You will need a responsible adult at least 73 years of age to accompany you and drive you home.   This person must remain in the waiting room during your procedure.  Wear loose fitting clothing that is easily removed.  Leave jewelry and other valuables at home.  However, you may wish to bring a book to read or an iPod/MP3 player to listen to music as you wait for your procedure to start.  Remove all body piercing jewelry and leave at home.  Total time from sign-in until discharge is approximately 2-3 hours.  You should go home directly after your procedure and rest.  You can resume normal activities the day after your procedure.  The day of your  procedure you should not:   Drive   Make legal decisions   Operate machinery   Drink alcohol   Return to work  You will receive specific instructions about eating, activities and medications before you leave.   The above instructions have been reviewed and explained to me by   Ezra Sites RN  May 24, 2009 1:14 PM     I fully understand and can verbalize these instructions _____________________________ Date _______

## 2010-05-02 NOTE — Procedures (Signed)
Summary: Colonoscopy  Patient: [name] Note: All result statuses are Final unless otherwise noted.  Tests: (1) Colonoscopy (COL)   COL Colonoscopy           DONE     [facility]     [address]     [place], [place]  [address]           COLONOSCOPY PROCEDURE REPORT           PATIENT:  [name], [name]  MR#:  [id number]     BIRTHDATE:  01-05-1936, 73 yrs. old  GENDER:  male           ENDOSCOPIST:  Hedwig Morton. Juanda Chance, MD     Referred by:           PROCEDURE DATE:  06/07/2009     PROCEDURE:  Colonoscopy 19147     ASA CLASS:  Class I     INDICATIONS:  colon polyps [phone] ( adenoma)           MEDICATIONS:   Versed 13 mg, Fentanyl 100 mcg           DESCRIPTION OF PROCEDURE:   After the risks benefits and     alternatives of the procedure were thoroughly explained, informed     consent was obtained.  Digital rectal exam was performed and     revealed no rectal masses.   The  endoscope was introduced through     the anus and advanced to the cecum, which was identified by both     the appendix and ileocecal valve, without limitations.  The     quality of the prep was adequate, using MiraLax.  The instrument     was then slowly withdrawn as the colon was fully examined.     <<PROCEDUREIMAGES>>           FINDINGS:  Two polyps were found. 2 mm diminutive polyps removed     from the rectum The polyps were removed using cold biopsy forceps     (see image6 and image7).  Internal and external hemorrhoids were     found (see image5 and image3).  This was otherwise a normal     examination of the colon (see image2 and image1).   Retroflexed     views in the rectum revealed no abnormalities.    The scope was     then withdrawn from the patient and the procedure completed.           COMPLICATIONS:  None           ENDOSCOPIC IMPRESSION:     1) Two polyps     2) Internal and external hemorrhoids     3) Otherwise normal examination     2 diminutive rectal polyps removed     RECOMMENDATIONS:     1) high fiber diet           REPEAT EXAM:  In 7 year(s) for.           ______________________________     Hedwig Morton. Juanda Chance, MD           CC:           n.     eSIGNED:   Hedwig Morton. Vana Arif at 06/07/2009 11:03 AM           Vilinda Boehringer, [phone]  Note: An exclamation mark (!) indicates a result that was not dispersed into the flowsheet. Document Creation Date: 06/07/2009 11:04 AM _______________________________________________________________________  (  1) Order result status: Final Collection or observation date-time: 06/07/2009 10:55 Requested date-time:  Receipt date-time:  Reported date-time:  Referring Physician:   Ordering Physician: Lina Sar [phone]) Specimen Source:  Source: Launa Grill Order Number: [id number] Lab site:   Appended Document: Colonoscopy     Procedures Next Due Date:    Colonoscopy: 05/2016

## 2010-05-02 NOTE — Miscellaneous (Signed)
Summary: [facility]  Clinical Lists Changes  Medications: Added new medication of MIRALAX   POWD (POLYETHYLENE GLYCOL 3350) As per prep  instructions. - Signed Added new medication of REGLAN 10 MG  TABS (METOCLOPRAMIDE HCL) As per prep instructions. - Signed Added new medication of DULCOLAX 5 MG  TBEC (BISACODYL) Day before procedure take 2 at 3pm and 2 at 8pm. - Signed Rx of MIRALAX   POWD (POLYETHYLENE GLYCOL 3350) As per prep  instructions.;  #255gm x 0;  Signed;  Entered by: Ezra Sites RN;  Authorized by: Hart Carwin MD;  Method used: Electronically to [facility] [address] [place], [place]  [id number], Ph: [phone], Fax: [phone] Rx of REGLAN 10 MG  TABS (METOCLOPRAMIDE HCL) As per prep instructions.;  #2 x 0;  Signed;  Entered by: Ezra Sites RN;  Authorized by: Hart Carwin MD;  Method used: Electronically to [facility]*, [address], [place], [place]  [id number], Ph: [phone], Fax: [phone] Rx of DULCOLAX 5 MG  TBEC (BISACODYL) Day before procedure take 2 at 3pm and 2 at 8pm.;  #4 x 0;  Signed;  Entered by: Ezra Sites RN;  Authorized by: Hart Carwin MD;  Method used: Electronically to [facility]*, [address], [place], [place]  [address], Ph: [phone], Fax: [phone] Observations: Added new observation of NKA: T (05/24/2009 12:58)    Prescriptions: DULCOLAX 5 MG  TBEC (BISACODYL) Day before procedure take 2 at 3pm and 2 at 8pm.  #4 x 0   Entered by:   Ezra Sites RN   Authorized by:   Hart Carwin MD   Signed by:   Ezra Sites RN on 05/24/2009   Method used:   Electronically to        [facility]* (retail)       [address]       [place], [place]  [id number]       Ph: [phone]       Fax: [phone]   RxID:   [id number] REGLAN 10 MG  TABS (METOCLOPRAMIDE HCL) As per prep instructions.  #2 x 0   Entered by:   Ezra Sites RN   Authorized by:   Hart Carwin MD   Signed by:    Ezra Sites RN on 05/24/2009   Method used:   Electronically to        [facility]* (retail)       [address]       [place], [place]  [id number]       Ph: [phone]       Fax: [phone]   RxID:   [id number] MIRALAX   POWD (POLYETHYLENE GLYCOL 3350) As per prep  instructions.  #255gm x 0   Entered by:   Ezra Sites RN   Authorized by:   Hart Carwin MD   Signed by:   Ezra Sites RN on 05/24/2009   Method used:   Electronically to        [facility]* (retail)       [address]       [place], [place]  [address]       Ph: [phone]       Fax: [phone]   RxID:   [id number]

## 2010-05-02 NOTE — Progress Notes (Signed)
  Phone Note Refill Request Message from:  Fax from Pharmacy on October 24, 2009 8:59 AM  Refills Requested: Medication #1:  DIAZEPAM 5 MG TABS 1 by mouth at bedtime. Please Advise refill  Initial call taken by: Ami Bullins CMA,  October 24, 2009 9:00 AM  Follow-up for Phone Call        ok for refill x 5 Follow-up by: Jacques Navy MD,  October 24, 2009 9:01 AM    Prescriptions: DIAZEPAM 5 MG TABS (DIAZEPAM) 1 by mouth at bedtime  #90 x 5   Entered by:   Ami Bullins CMA   Authorized by:   Jacques Navy MD   Signed by:   Bill Salinas CMA on 10/24/2009   Method used:   Telephoned to ...       [facility]* (retail)       [address]       [place], [place]  [address]       Ph: [phone]       Fax: [phone]   RxID:   [id number]

## 2010-05-02 NOTE — Letter (Signed)
Summary: Patient Notice- Polyp Results  [facility]  [address] [place], [place] [address]   Phone: [phone]  Fax: [phone]        June 10, 2009 MRN: [id number]    [name] [address] [place], [place]  [address]    Dear [name],  I am pleased to inform you that the colon polyp(s) removed during your recent colonoscopy was (were) found to be benign (no cancer detected) upon pathologic examination.The polyp was hyperplastic ( not precancerous)  I recommend you have a repeat colonoscopy examination in 7_ years to look for recurrent polyps, as having colon polyps increases your risk for having recurrent polyps or even colon cancer in the future.  Should you develop new or worsening symptoms of abdominal pain, bowel habit changes or bleeding from the rectum or bowels, please schedule an evaluation with either your primary care physician or with me.  Additional information/recommendations:  _x_ No further action with gastroenterology is needed at this time. Please      follow-up with your primary care physician for your other healthcare      needs.  __ Please call [phone] to schedule a return visit to review your      situation.  __ Please keep your follow-up visit as already scheduled.  __ Continue treatment plan as outlined the day of your exam.  Please call us if you are having persistent problems or have questions about your condition that have not been fully answered at this time.  Sincerely,  Hart Carwin MD  This letter has been electronically signed by your physician.  Appended Document: Patient Notice- Polyp Results Letter mailed 3.15.11.

## 2010-05-04 NOTE — Progress Notes (Signed)
  Phone Note Refill Request Message from:  Fax from Pharmacy  Refills Requested: Medication #1:  DIAZEPAM 5 MG TABS 1 by mouth at bedtime as needed   Last Refilled: 03/18/2010 fax from [facility], please advise  Initial call taken by: Ami Bullins CMA,  April 25, 2010 11:03 AM  Follow-up for Phone Call        ok to refill x 5 Follow-up by: Jacques Navy MD,  April 25, 2010 11:29 AM    Prescriptions: DIAZEPAM 5 MG TABS (DIAZEPAM) 1 by mouth at bedtime as needed  #30 x 5   Entered by:   Ami Bullins CMA   Authorized by:   Jacques Navy MD   Signed by:   Bill Salinas CMA on 04/25/2010   Method used:   Telephoned to ...       [facility]* (retail)       [address]       [place], [place]  [id number]       Ph: [phone]       Fax: [phone]   RxID:   [id number]

## 2010-08-15 NOTE — Assessment & Plan Note (Signed)
[facility]                            CARDIOLOGY OFFICE NOTE   [name], [name]                      MRN:          [id number]  DATE:12/16/2007                            DOB:          Dec 30, 1935    REFERRING PHYSICIAN:  Azalia Bilis, MD, @ [facility]   PRIMARY CARE PHYSICIAN:  Rosalyn Gess. Norins, MD   CLINICAL HISTORY:  [name] is a 75 year old and has been a patient  of [name] and [name].  He has a history of hypertension,  but no prior history of heart disease.  About 2 weeks ago, when he was  with Dr. Terrial Perry, he developed lower substernal epigastric pain,  which was rather intense and felt somewhat like indigestion.  He was  slightly sweaty.  He went to the emergency room and was admitted  overnight, and ruled out for myocardial infarction.  He was subsequently  scheduled for a Myoview scan and a gallbladder ultrasound and a followup  consultation with me in the office today.  He had no recurrence of his  symptoms.  He had 2 prior episodes similar to the previous episodes,  where he has had epigastric pain which had been fairly intense, but  which has resolved spontaneously and associated with some diaphoresis  and nausea.  Pains have the quality of heartburn.   He has had no exertionally related chest pain or shortness of breath.  He works out four times swimming in the pool.   His past medical history is significant for hypertension.  He has lumbar  disk disease and has had previous fusion with rods by Dr. Newell Perry.  He  has also had arthroscopic knee surgery.  His family history is negative  for cardiovascular disease.  His mother died at age 98.  His father died  at age 44.   CURRENT MEDICATIONS:  1. Aspirin 81 mg.  2. Hydrochlorothiazide 12.5 mg  3. Vitamins.   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  Has one son in business with him.  He does not  smoke.   REVIEW OF SYSTEMS:  Positive symptoms related to his low back pain.   PHYSICAL EXAMINATION:  VITAL SIGNS:  Blood pressure is 115/75, pulse 75  and regular.  His weight was 232 pounds.  NECK:  There was no venous distension.  The carotid pulses were full  without bruits.  CHEST:  Clear without rales or rhonchi.  CARDIAC:  Rhythm was regular.  Heart sounds were normal.  There were no  murmurs or gallops.  ABDOMEN:  Soft with normal bowel sounds.  There was no  hepatosplenomegaly and no right upper quadrant tenderness.  EXTREMITIES:  Peripheral pulses were full.  There is no peripheral  edema.  MUSCULOSKELETAL:  No deformities.  SKIN:  Warm and dry.  NEUROLOGIC:  No focal neurologic signs.   Electrocardiogram has been normal.   Exercise rest stress Myoview scan showed no ST changes with exercise, no  chest pain, and no abnormal effusion.  Ejection fraction was 55%.  He  had a gallbladder ultrasound, which has shown numerous small gallstones.   Liver function tests were normal.  His lipid profiles in the past have  been fairly good with a total cholesterol of 207, HDL 57, and LDL of  119.   IMPRESSION:  1. Epigastric pain most consistent with pain due to cholelithiasis.  2. Hypertension.  3. No evidence of ischemic heart disease with negative Myoview scan.   RECOMMENDATIONS:  I think his symptoms are likely related to his  cholelithiasis and with multiple small stones.  I think he will probably  need surgery for this.  There is no evidence for significant ischemic  heart disease, and I think his cardiac risk for surgery should be quite  low.  We have arranged for him to see Dr. Lina Sar for further  evaluations and recommendations.     Bruce Elvera Lennox Juanda Chance, MD, [facility]  Electronically Signed    BRB/MedQ  DD: 12/16/2007  DT: 12/16/2007  Job #: [id number]

## 2010-08-15 NOTE — Discharge Summary (Signed)
[name], [name]               ACCOUNT NO.:  [id number]   MEDICAL RECORD NO.:  [id number]          PATIENT TYPE:  INP   LOCATION:  2036                         FACILITY:  [facility]   PHYSICIAN:  Jonelle Sidle, MD DATE OF BIRTH:  19-Apr-1935   DATE OF ADMISSION:  11/29/2007  DATE OF DISCHARGE:  11/30/2007                               DISCHARGE SUMMARY   PRIMARY CARDIOLOGIST:  Everardo Beals. Juanda Chance, MD, [facility]   PRIMARY CARE Bradd Merlos:  Rosalyn Gess. Norins, MD   DISCHARGE DIAGNOSIS:  Chest and epigastric pain.   SECONDARY DIAGNOSES:  1. Hypertension.  2. History of cervical and lumbar back surgeries.  3. History of bilateral knee arthroscopic surgery.  4. Status post hemorrhoidectomy.  5. Remote tonsillectomy.  6. History of polio as a youth.  7. Seasonal allergies.   ALLERGIES:  No known drug allergies.   PROCEDURES:  None.   HISTORY OF PRESENT ILLNESS:  A 75 year old married Caucasian male  without prior cardiac history.  He did have a negative stress test in  2004 revealing no ischemia and normal EF.  Over the past month or so, he  has had three episodes of lower chest and epigastric burning sensation  occurring exclusively approximately 20 to 30 minutes after a meal and  lasting about 30 minutes and resolving spontaneously.  Last night, he  had just finished dinner at a friend's house and developed sudden lower  chest and epigastric burning discomfort associated with diaphoresis.  His hosts were concerned and recommended that he present to the [facility] ED.  In the ED, he was treated with GI cocktail with almost  immediate relief of symptoms after approximately an hour duration.  He  was seen by Cardiology and admitted for further evaluation.   HOSPITAL COURSE:  The patient ruled out for MI.  His ECG does show T-  wave inversion in lead III which is old.  He otherwise has no acute ST-T  changes.  He has had no recurrent symptoms.  We have noted elevated AST  at 114, and ALT  of 68, although his abdominal exam is benign without any  tenderness.   [name] has been noted to be hypokalemic with a potassium at 3.1 in  the emergency room.  We have replaced this prior to discharge and  provided him with a prescription for potassium 20 mEq daily as he does  take hydrochlorothiazide at home.  We have also recommended that he  initiate Prilosec OTC 20 mg daily.  We will arrange for followup with  Dr. Juanda Chance as well as an exercise Myoview this week.   DISCHARGE LABORATORY DATA:  Hemoglobin 15.0, hematocrit 44.0, WBC 9.8,  platelets 223.  Sodium 140, potassium 3.1(replaced prior to discharge),  chloride 106,  glucose 111, BUN 26, creatinine 1.3, total bilirubin 0.7,  alkaline phosphatase 177, AST 114, ALT 68, total protein 6.7, albumin  3.5, lipase 37, amylase is pending, CK 86, MB 1.7, and troponin I 0.01.   DISPOSITION:  The patient is being discharged home today in good  condition.   FOLLOWUP PLANS AND APPOINTMENT:  We will arrange for an exercise Myoview  in our office this week.  I will also arrange for followup with Dr.  Juanda Chance in approximately 2 weeks.  He was asked to follow up with Dr.  Debby Bud.  He is scheduled.   DISCHARGE MEDICATIONS:  1. Aspirin 81 mg daily.  2. Hydrochlorothiazide 25 mg daily.  3. K-Dur 20 mEq daily.  4. Multivitamin 1 daily.  5. Os-Cal 2 tablets daily.  6. Omeprazole OTC 20 mg daily.   OUTSTANDING LABORATORY STUDIES:  Amylase is pending.   DURATION OF DISCHARGE ENCOUNTER:  60 minutes including physician time.      [name], ANP      Jonelle Sidle, MD  Electronically Signed    CB/MEDQ  D:  11/30/2007  T:  11/30/2007  Job:  [id number]   cc:   Rosalyn Gess. Norins, MD

## 2010-08-15 NOTE — Assessment & Plan Note (Signed)
[facility]                           PRIMARY CARE OFFICE NOTE   BARTOW, ZYLSTRA                      MRN:          [id number]  DATE:09/23/2006                            DOB:          1935/08/02    SUBJECTIVE:  [name] is a 75 year old Caucasian gentleman who has  been followed long-term by Dr. Cecil Cranker.  He presents today to  establish ongoing continuing care.  He is feeling well and has no active  complaints.   PAST SURGICAL HISTORY:  1. Tonsillectomy, remote.  2. Arthroscopic surgeries, right and left, by Dr. Elana Alm. Wainer,      with good results.  3. Hemorrhoidectomy.  4. Cervical fusion in 2006, by Dr. Hewitt Shorts.  5. Herniated nucleus pulposus at L5-S1 with micro-diskectomy on July 25, 2006, with good results.   PAST MEDICAL HISTORY:  1. Usual childhood diseases.  2. Shingles at age 89.  3. Polio as a youth (epiphysitis?).  4. History of allergies.  5. Hypertension.   FAMILY HISTORY:  Father died at age 73.  Had myasthenia gravis.  Mother  died at age 69, of general old age.  There is no family history for  coronary artery disease, colon cancer or prostate cancer or diabetes.   SOCIAL HISTORY:  The patient is married for 44 years.  He has lived in  [place] for 44 years.  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  He  continues to work full-time, although he spends less time on the road,  delegating this to younger colleagues.  The patient has two adopted  sons, one who is in business with him, one who is a starting actor on  the [address]  He reports his marriage is in good health.   CHART REVIEW:  The patient's last stress Cardiolite on July 25, 2002,  rated as a normal study, with no evidence of ischemia with a normal  ejection fraction.   His last colonoscopy on June 15, 2003, with colon polyps, internal and  external  hemorrhoids.  The polyps revealed no high-grade dysplasia or  invasive malignancy identified.  The patient was recommended to have  followup in 2009, per Dr. Blossom Hoops note.   The last note from Dr. Lynelle Smoke I. Patsi Sears was October 25, 1993, when the  patient had benign prostatic hypertrophy and was stable.   The patient had a neurosurgical evaluation in 1987, for a C6  radiculopathy.  Also had a neurologic evaluation on December 18, 1984,  for the same problem.   LABORATORY DATA:  The last general laboratory from 2004, was  unremarkable with an LDL at that time of 132.   An abdominal ultrasound performed on September 01, 2002, secondary to  increased liver function tests, was a normal study with no abnormalities  noted.   The last 12-lead electrocardiogram per Dr. Glennon Hamilton was unremarkable.   CURRENT MEDICATIONS:  1. Zyrtec.  2. Aspirin 81 mg daily.  3. Hydrochlorothiazide 12.5 mg daily.  4. Multivitamins.  5.  Allergy shots weekly from Dr. Kerry Kass.  6. Glucosamine with MSN daily.  7. Vitamin C daily.   REVIEW OF SYSTEMS:  The patient has had no fevers, sweats or chills,  weight loss or insomnia.  The patient has no opthal or dental complaints  or problems.  No cardiovascular problems.  RESPIRATORY:  Significant for  allergy-type symptoms, managed by Dr. Stevphen Rochester, otherwise  negative.  No GI or GENITOURINARY complaints except for nocturia x1.  MUSCULOSKELETAL:  No complaints.   PHYSICAL EXAMINATION:  VITAL SIGNS:  Temperature 97 degrees, blood  pressure 117/73, pulse 79, weight 237 pounds.  GENERAL:  This a well-nourished, mildly overweight Caucasian gentleman  who looks his stated age, in no acute distress.  HEENT:  Normocephalic and atraumatic.  EACs and tympanic membranes were  unremarkable.  Oropharynx negative.  Dentition was in good repair, with  no buccal or palatal lesions noted.  Posterior pharynx clear.  Conjunctivae and sclerae clear.  Pupils equal,  round, reactive to light  and accommodation.  EOMI.  Funduscopic exam with a hand-held instrument  was unremarkable with no abnormalities noted.  NECK:  Supple with a well-healed surgical scar in the left anterior  aspect.  No thyromegaly was noted.  NODES:  No adenopathy was noted in the submandibular, cervical,  supraclavicular or axillary regions.  CHEST:  No CVA tenderness.  LUNGS:  Clear to auscultation and percussion.  CARDIOVASCULAR:  With 2+ radial pulses.  No jugular venous distention or  carotid bruits.  He had a quiet precordium with a regular rate and  rhythm, without murmurs, rubs or gallops.  ABDOMEN:  Soft, no guarding, no rebound.  No organomegaly, no  splenomegaly was appreciated.  GENITOURINARY:  Genitalia exam unremarkable with normal bilaterally  descending testicles.  RECTAL:  Revealed normal sphincter tone.  Prostate was smooth, normal  size and contour, without abnormalities.  EXTREMITIES:  Without clubbing, cyanosis or edema.  No deformity.  NEUROLOGIC:  Exam was nonfocal.  SKIN:  Clear.   A two-view chest x-ray was negative for any acute cardiopulmonary  process and was a normal study.   LABORATORY DATA:  I did review hospital laboratory from July 19, 2006,  which showed a normal BMET with a glucose of 104.  Normal CBC.  The  patient will return at his convenience for repeat laboratory, including  a lipid panel.   ASSESSMENT/PLAN:  1. Allergy:  The patient is well-controlled with his current regimen      with Dr. Stevphen Rochester.  He will continue the same.  2. Hypertension:  The patient's blood pressure is very well controlled      on low-dose hydrochlorothiazide.  He will continue the same.  3. Neurosurgical:  The patient had micro-diskectomy at L5-S1, with      excellent results, with normal level of activity, with no      significant limitations.  No pain or discomfort.  4. Weight management:  The patient has a BMI that is 34 kg per sq/m.     The  patient has a height of 5 feet 10 inches and a weight of 237      pounds.  This equals obesity, class 1.  Plan for weight loss      program, including continue his current exercise regimen, calorie      restriction with appropriate food choices, with limited portion      size.  The goal is for the patient to lose 12 pounds per year, with  a target weight of 200 pounds.  This would bring the patient to an      overweight category and actually would be adequate for fitness and      health.  5. Health maintenance:  The patient is currently up-to-date with a      colorectal cancer screening and a full examination as noted.   In summary, this is a pleasant gentleman being basically stable.  The  recommendations are outlined above.  The patient will be notified by  phone of his laboratory results.  He was oriented to the services of the  practice.  He is asked to return to see me on a one-year basis or as  needed.  He may return at his convenience for his zoster vaccine.     Rosalyn Gess Norins, MD  Electronically Signed    MEN/MedQ  DD: 09/24/2006  DT: 09/24/2006  Job #: [id number]

## 2010-08-15 NOTE — H&P (Signed)
NAMEEDGARDO, [name]               ACCOUNT NO.:  [id number]   MEDICAL RECORD NO.:  [id number]          PATIENT TYPE:  INP   LOCATION:  2036                         FACILITY:  [facility]   PHYSICIAN:  Satira Anis, MDDATE OF BIRTH:  1936/01/24   DATE OF ADMISSION:  11/29/2007  DATE OF DISCHARGE:                              HISTORY & PHYSICAL   REFERRING PHYSICIAN:  Azalia Bilis, MD   INDICATION FOR ADMISSION:  Chest pain syndrome.   HISTORY OF PRESENT ILLNESS:  The patient is a very pleasant 75 year old  gentleman and has been having dinner with family.  He developed  epigastric burning type chest discomfort, which was moderate in  intensity, and was slowly resolved in about an hour.  He also had mild  diaphoresis associated with these symptoms.  He does exercise on a  regular basis and gets his heart rate up to 70% of the maximum predicted  heart rate and does not report any anginal type chest discomfort.  He  had cardiac workup three years ago, which included stress study, and it  was negative for myocardial ischemia.  He reports that his cholesterol  is within normal limits, though I do not have any data to confirm the  same.  His cardiovascular risk factors include hypertension, which is  controlled with hydrochlorothiazide 25 mg once a day.  He has no history  of smoking abuse.  He has no immediate family members that have coronary  artery disease.  He does not offer any other complaints.  He denies any  shortness of breath, palpitations, and near or frank syncope.  He denies  any heart failure symptoms.   PAST MEDICAL HISTORY:  Chronic hypertension.   SOCIAL HISTORY:  No drug abuse.  Nondrinker and nonsmoker.  Lives with  his wife.   FAMILY HISTORY:  Mother died at the age of 64 from unknown causes.  Father died from myasthenia gravis/leukemia.  His grandfather died at  the age of 44 from presumably myocardial infarction.   MEDICATIONS:  Multivitamin supplements,  hydrochlorothiazide 25 mg once a  day.   ALLERGIES:  None.   PHYSICAL EXAMINATION:  VITAL SIGNS:  Blood pressure is 138/87, pulse  rate is 77, respiratory rate is 12, saturation is 97%, and temperature  is 97.9.  HEAD AND NECK:  Scars are noted in the left neck from prior surgery to  his cervical spine.  Trachea is midline.  Thyroid is normal in size.  EENT:  Anicteric sclerae.  No pallor of the conjunctivae.  Mucous  membranes are moist.  CHEST:  Symmetrical equal air entry.  Normal breath sounds.  S1, S2,  regular.  No murmurs and no gallops.  No pericardial rub.  ABDOMEN:  Soft.  No hepatosplenomegaly and no abdominal tenderness.  No  abdominal bruits.  Bowel sounds appear to be normoactive.  EXTREMITIES:  Normal pulses.  No edema, cyanosis, or clubbing.  NEUROLOGICAL:  Grossly nonfocal with intact cognition.   DIAGNOSTIC DATA:  Electrocardiogram shows sinus rhythm with no acute  changes suggestive of myocardial ischemia or injury.  Compared to an  earlier EKG  in April 2008, it is essentially unchanged.  Other  laboratory values are pending at the time of this dictation.   ASSESSMENT AND PLAN:  Epigastric discomfort/chest pain:  Appears to be  more gastrointestinal in etiology.  However, we will admit him for  observation to the telemetry unit and obtain serial cardiac enzymes.  He  will be continued on his home medications, and should his clinical  situation change with recurrent chest discomfort, then he will be placed  on the unstable angina pathway and further decisions will be made.  Until then, he will be in observation and other plans will be made by  Dr. Charlies Constable upon his evaluation.      Satira Anis, MD  Electronically Signed     RN/MEDQ  D:  11/29/2007  T:  11/30/2007  Job:  [id number]

## 2010-08-15 NOTE — Op Note (Signed)
NAMEJHAYDEN, [name]               ACCOUNT NO.:  [id number]   MEDICAL RECORD NO.:  [id number]          PATIENT TYPE:  INP   LOCATION:  3172                         FACILITY:  [facility]   PHYSICIAN:  Hewitt Shorts, M.D.DATE OF BIRTH:  Jun 21, 1935   DATE OF PROCEDURE:  DATE OF DISCHARGE:                               OPERATIVE REPORT   PREOPERATIVE DIAGNOSES:  Lumbar stenosis, lumbar dynamic degenerative  spondylolisthesis, lumbar spondylosis, and lumbar degenerative disc  disease.   POSTOPERATIVE DIAGNOSIS:  Same.   PROCEDURE:  Bilateral L4-L5 and L5-S1 lumbar laminotomy, facetectomy,  and foraminotomy with decompression of the exiting L4, L5, and S1 nerve  roots bilaterally with microdissection.  Bilateral L4-L5 posterior  lumbar interbody fusion with PEEK interbody implants and mosaic with  bone marrow aspirate and bilateral L4-S1 posterolateral arthrodesis with  radius post instrumentation and mosaic with bone marrow aspirate.   SURGEON:  Hewitt Shorts, M.D.   ASSISTANT:  Nelia Shi. Webb Silversmith, RN and Dr. Lovell Sheehan.   ANESTHESIA:  General endotracheal.   INDICATIONS:  The patient is a 75 year old man who presented with  neurogenic claudication.  He was found to have advanced degenerative  changes including significant bilateral L4-L5 facet arthropathy with  dynamic degenerative spondylolisthesis and lumbar stenosis as well as  significant right L5-S1 facet arthropathy with associated lateral recess  stenosis.  There was bilateral neural foraminal stenosis at L4-L5 and a  decision was made to proceed with decompression bilaterally at L4-L5 and  L5-S1 and stabilization.   PROCEDURE:  The patient was brought to the operating room and placed  under general endotracheal anesthesia.  The patient was turned to prone  position.  Lumbar region was prepped with Betadine soap and solution and  draped in sterile fashion.  The midline was infiltrated with local  anesthetic with  epinephrine and the midline incision was made and  carried down to the subcutaneous tissue.  Bipolar electrocautery was  used to maintain hemostasis.  Dissection was then carried out lumbar  fascia, which was incised bilaterally.  Paraspinal muscles were  dissected with spinal process and lamina in a subperiosteal fashion.  The L4-L5 and L5-S1 intralaminar space was identified and x-ray was  taken to confirm the localization.  A self-retaining retractor was  placed and dissection was then carried out laterally exposing  hypertrophic facet complexes particularly bilaterally at the L4-L5  level.  Using magnification and microdissection, dissection was carried  out laterally.  The hypertrophic facets were removed using the X-Max  drill and Leksell rongeur.  We exposed the transverse process at L4-L5  bilaterally as well as the ala of S1.  The facet joints were opened and  markedly degenerated, and bilateral laminectomy and facetectomy was  performed at L4-L5, and we then carefully dissected and removed the  second ligamentum flavum, although on the left side there was a more  piece of ligamentum flavum densely adherent to the thecal sac and rather  than cause a disruption of the thecal sac that small bit was left  attached to the thecal sac.  In the end, good decompression of the  thecal sac and exiting L4 and L5 nerve roots was achieved.  We then did  bilateral laminotomies with facetectomies at L5-S1, decompressing the  thecal sac and exiting S1 nerve roots bilaterally.  We then turned our  attention to doing an interbody fusion at the L4-L5 level.  The  overlying epidural vents were coagulated and the annulus was incised.  The disk space edge and thorough discectomy was performed using a  variety of pituitary rongeurs and micro curettes.  Ossific overgrowth  from the posterior aspect of the vertebral bodies was removed using an  osteophyte removal tool.  Then, we prepared the endplates  using paddle  curettes to scrape away the cartilage endplate surface.  We measured the  height of intravertebral disc space, and selected two 8 mm, and 25 mm in  depth with 4 degrees of lordosis implants.  We then probe the left L5  pedicle.  Bone marrow aspirate was aspirated and was injected over 15 mL  strip of mosaic and then mosaic with bone marrow aspirate was packed in  the interbody implants.  We first placed the implant in the right side  then we packed additional mosaic in the intravertebral disc space in the  midline and then placed a second implant on the left side.  We packed  additional mosaic with bone marrow aspirate lateral to each of the  implants as well.  We then proceed with posterolateral arthrodesis.  With field fluoroscopic guidance, we selected pedicle entry sites  bilaterally at L4, on the right side at L5, and bilaterally at S1.  The  left L5 pedicle door had been probed.  Each of the pedicles was probed.  We examined with a ball probe.  Good bony surfaces were noted.  No cut  offs were found.  Each of them was tapped.  The L4-L5 pedicles were  tapped with a 5.25 mm tap.  The S1 pedicle was tapped with 6.25 mm tap.  Each was again examined with a ball probe, good threading was noted, not  cut offs were found, and then we placed 5.75 mm screws at the L4 and L5  using 45 mm screws at L4 and 40 mm screws at L5.  We placed 6.75 x 35 mm  screws at S1.  Once the screws were positioned, we selected straight  rods placing a 50 mm rod on the right and a 60 mm rod on the left,  locking taps were placed and secured and then final tightening was done  against the counter torque of all 6 locking taps prior to placing the  screws, the transverse process at L4-L5 as well as ala of S1 which are  decorticated with the X-Max drill.  We then, after the screws and rods  were placed, packed mosaic with bone marrow aspirate in the lateral  gutters open transverse process and ala and  the lateral gutters.  We  then irrigated the wound extensively with bacitracin solution, checked  for hemostasis and establishing it firm, and proceed with closure.  The  deep fascia was closed with interrupted undyed 1-0 Vicryl suture.  The  Scarpa fascia closed with interrupted undyed 1-0 Vicryl suture.  The  subcutaneous tissues were irrigated with saline solution and then we  closed the subcutaneous and subcuticular closed with interrupted  inverted 2-0 Vicryl sutures.  The skin was closed with surgical staples.  Wound was dressed with Adaptic and sterile gauze.  Procedure was  tolerated well.  The estimated blood  loss was 500 mL.  Sponge count was  correct.  We did give the patient back 250 mL of cell saver blood.  The  patient was turned back to the supine position, reversing the  anesthetic, extubated, and transferred to the recovery room for further  care.      Hewitt Shorts, M.D.  Electronically Signed     RWN/MEDQ  D:  03/17/2007  T:  03/18/2007  Job:  [id number]

## 2010-08-18 NOTE — Op Note (Signed)
NAMERICHEY, DOOLITTLE               ACCOUNT NO.:  [id number]   MEDICAL RECORD NO.:  [id number]          PATIENT TYPE:  AMB   LOCATION:  DSC                          FACILITY:  [facility]   PHYSICIAN:  Robert A. Thurston Hole, M.D. DATE OF BIRTH:  24-Aug-1935   DATE OF PROCEDURE:  01/08/2005  DATE OF DISCHARGE:                                 OPERATIVE REPORT   PREOPERATIVE DIAGNOSES:  Right knee medial and lateral meniscal tears with  chondromalacia and synovitis.   POSTOPERATIVE DIAGNOSES:  Right knee medial and lateral meniscal tears with  chondromalacia and synovitis.   PROCEDURE:  1.  Right knee examination under anesthesia followed by arthroscopic partial      medial and lateral meniscectomy.  2.  Right knee chondroplasty with partial synovectomy.   SURGEON:  Dr. Salvatore Marvel.   ASSISTANT:  Kirstin Tomasa Rand, P.A.   ANESTHESIA:  General.   OPERATIVE TIME:  40 minutes.   COMPLICATIONS:  None.   INDICATIONS FOR PROCEDURE:  Mr. Foree is a 75 year old gentleman who has  had three months of increasing right knee pain with exam and MRI documenting  medial meniscus tear and chondromalacia who has failed conservative care and  is now to undergo arthroscopy.   DESCRIPTION:  Mr. Masini is brought to the operating room on January 08, 2005 after a knee block had been placed preoperatively.  He was placed on  the operative table in the supine position. After being placed under general  anesthesia, his right knee was examined. Range of motion from 0 to 125  degrees.  Knee stable ligamentous exam with normal patellar tracking. The  right leg was prepped using sterile DuraPrep and draped using sterile  technique. Originally, through an anterolateral portal, the arthroscope was  placed with a pump attached and through an anteromedial portal, an  arthroscopic probe was placed. On initial inspection of the medial  compartment, he had 30-40% grade III chondromalacia which was debrided.  Medial meniscus tearing of the posteromedial horn was 50-60% was resected  back to a stable rim. The intercondylar notch was inspected.  The anterior  and posterior cruciate ligaments were normal. The lateral compartment was  inspected. The articular cartilage showed 30% grade III chondromalacia which  was debrided. The lateral meniscus tear, posterior and lateral horn and  anterior horn of which 30% was resected back to a stable rim. The  patellofemoral joint showed 25% grade III chondromalacia which was debrided.  The patella tracked normally. The medial and lateral gutter showed moderate  synovitis which was debrided. Otherwise, they are free of pathology. After  this was done, it was felt that all pathology had been satisfactorily  addressed. The instruments were removed. The portals were closed with 3-0  nylon suture and injected with 0.25% Marcaine with epinephrine and 4 mg of  morphine. Sterile dressings were applied and the patient awakened and taken  to the recovery room in stable condition.   FOLLOW-UP CARE:  Mr. Brink will be followed as an outpatient on Vicodin  and Voltaren.  I will see him back in the office in a week  for sutures out  and follow-up.      Robert A. Thurston Hole, M.D.  Electronically Signed     RAW/MEDQ  D:  01/08/2005  T:  01/08/2005  Job:  [id number]

## 2010-08-18 NOTE — Op Note (Signed)
[name], [name]               ACCOUNT NO.:  [id number]   MEDICAL RECORD NO.:  [id number]          PATIENT TYPE:  INP   LOCATION:  3172                         FACILITY:  [facility]   PHYSICIAN:  Hewitt Shorts, M.D.DATE OF BIRTH:  [birth year]   DATE OF PROCEDURE:  07/25/2006  DATE OF DISCHARGE:                               OPERATIVE REPORT   PREOPERATIVE DIAGNOSIS:  Left L3-4 foraminal lumbar disk herniation,  lumbar spondylosis, lumbar degenerative disk disease, and lumbar  radiculopathy.   POSTOPERATIVE DIAGNOSIS:  Left L3-4 foraminal lumbar disk herniation,  lumbar spondylosis, lumbar degenerative disk disease, and lumbar  radiculopathy.   PROCEDURE:  Left L3 hemilaminectomy, left L3-4 extraforaminal  exploration, left L3-4 microdiskectomy with microdissection.   SURGEON:  Hewitt Shorts, M.D.   ASSISTANTS:  1. Russell L. Webb Silversmith, R.N.  2. Stefani Dama, M.D.   ANESTHESIA:  General endotracheal.   INDICATION:  The patient is a 76 year old man who presented with a  severe and acute left L3 lumbar radiculopathy.  He was studied with an  MRI scan and subsequently with myelogram, postmyelogram CT scan.  It is  felt that he most likely had a disk herniation within the left L3-4  neuroforamen compression the exiting left L3 nerve root.  Decision made  to proceed with diskectomy, and because of the necessary exposure, it  was felt that fusion might be necessary if there was significant  disruption of the joint.   PROCEDURE:  The patient was brought to the operating room, placed under  general endotracheal anesthesia.  The patient was turned to a prone  position.  Lumbar region was shaved and then prepped with Betadine  soaping solution, and draped in a sterile fashion.  An x-ray was taken  and the L3-4 level identified.  The midline was infiltrated with local  anesthetic with epinephrine.  A midline incision was made and carried  down through the subcutaneous tissue.   Bipolar cautery and  electrocautery was used to maintain hemostasis.  Dissection was carried  down to the lumbar fascia which was incised on the left side of the  midline and the paraspinal muscle were dissected through the spinous  process and lamina in a subperiosteal fashion.  A self-retaining  retractor was placed and the L3-4 intralaminar space identified.  An x-  ray was taken to confirm the localization and then the microscope was  draped and brought into the field to provide additional magnification,  illumination and visualization.  The decompressions were performed using  microdissection and microsurgical technique.  We first performed a left  L3 hemilaminectomy using the XMax drill and Kerrison punches.  Edges of  the bone were waxed as needed.  The ligamentum flavum was carefully  removed and we identified the underlying thecal sac.  The dissection was  carried rostrally and we identified the left L3 nerve root.  There was a  moderate amount of epidural veins that were coagulated as needed.  We  identified the annulus of the L3-4 disk.  We explored the neuroforamen  from within the canal with a variety of microhooks but  no distinct disk  fragment was mobilized.  Dr. Danielle Dess suggested that we then explore from  an extraforaminal approach and the intertransverse muscles were  dissected from the transverse process of L4 and the lateral aspect of  the facet complex.  We dissected down.  A portion of the superior  articular facet of L4 laterally was removed using the XMax drill and  Kerrison punches and we were able to dissect down to the annulus of the  L3-4 disk.  We then identified the exiting L3 nerve root in the  extraforaminal space.  Again, veins were coagulated and divided.  There  appeared to be subligamentous disk protrusion and we incised the annulus  and removed several fragments from within the annulus, entered into the  disk space and proceeded with diskectomy using a  variety of pituitary  rongeurs.  Then we further examined the neuroforamen and as we did, we  were able to mobilize several fragments of herniated disk with 3  fragments located within the neuroforamen ventral to the left L3 nerve.  These were gradually mobilized into our exposure and removed and we were  able to decompress the left L3 nerve and remove all of the loose disk  fragments from within the neuroforamen and thereby decompress the  exiting nerve.  We examined the neuroforamen and the nerve from both  within the canal as well as from our extraforaminal exposure using a  variety of hooks and in the end, it was felt that good decompression of  the nerve was achieved and that all loose fragments of disk material  that had herniated had been removed.  It was felt that all loose  fragments of disk material from the disk space had been removed and in  the end it was felt that good decompression had been achieved.  The  wound was irrigated with bacitracin solution, checked for hemostasis,  which was established with the use of bipolar cautery was well as  Gelfoam soaked in thrombin.  We were able to remove all of the Gelfoam.  The wound was irrigated and it was confirmed that good hemostasis had  been established, and we then instilled 2 mL of fentanyl and 80 mg of  Depo-Medrol in the area around the nerve root, both within the canal as  well as extraforaminally.  We then proceeded with closure.  The deep  fascia was closed with interrupted undyed 1 Vicryl sutures.  Scarpa's  fascia was closed with interrupted undyed 1 Vicryl sutures, and the  subcutaneous and subcuticular layers were closed with interrupted  inverted 2-0 undyed Vicryl sutures.  The skin was reapproximated with  Dermabond.  The procedure was tolerated well.  The estimated blood loss  was 100 mL.  We did use a Cell Saver during the procedure but there was insufficient blood loss to spin down the collected blood.  Sponge  and  needle count were correct.   We had been prepared to proceed with an L3-4 arthrodesis if we had  disrupted the L3-4 facet complex, however, it was felt by both myself  and Dr. Danielle Dess that the left L3 pars intraarticularis remained intact  and that the left L3-4 facet complex remained intact and that  arthrodesis was not necessary.   Following surgery, the patient was turned back to supine position to be  reversed from anesthetic, extubated and transferred to the recovery room  for further care.      Hewitt Shorts, M.D.  Electronically Signed  RWN/MEDQ  D:  07/25/2006  T:  07/25/2006  Job:  [id number]

## 2010-08-18 NOTE — Op Note (Signed)
NAME:  CAGE, GUPTON                         ACCOUNT NO.:  [id number]   MEDICAL RECORD NO.:  [id number]                   PATIENT TYPE:  INP   LOCATION:  2869                                 FACILITY:  [facility]   PHYSICIAN:  Hewitt Shorts, M.D.            DATE OF BIRTH:  01/16/1936   DATE OF PROCEDURE:  04/22/2002  DATE OF DISCHARGE:                                 OPERATIVE REPORT   PREOPERATIVE DIAGNOSES:  1. C3-4 cervical disk herniation.  2. C4-5, C5-6, and C6-7 cervical spondylosis and degenerative disk disease.  3. Multi-level, multi-factorial cervical stenosis with cervical myelopathy.   POSTOPERATIVE DIAGNOSES:  1. C3-4 cervical disk herniation.  2. C4-5, C5-6, and C6-7 cervical spondylosis and degenerative disk disease.  3. Multi-level, multi-factorial cervical stenosis with cervical myelopathy.   PROCEDURE:  C3-4, C4-5, C5-6, and C6-7 anterior cervical diskectomy and  arthrodesis with allograft and Premier cervical plating.   SURGEON:  Hewitt Shorts, M.D.   ASSISTANT:  Coletta Memos, M.D.   ANESTHESIA:  General endotracheal.   INDICATIONS:  The patient is a 75 year old man who sustained an injury in  August of 2003.  MRI at that time revealed multi-level, multi-factorial  spondylosis, degenerative disk disease, and stenosis at the C4-5, C5-6 and  C6-7 levels; however, he was not significantly symptomatic.  Last month,  however, he presented with Lhermitte's phenomenon.  MRI was repeated last  week and revealed, in addition to the degenerative changes seen at the  previously noted levels, a central disk herniation at C3-4.  The decision  was made to proceed with a four-level anterior cervical diskectomy and  fusion.   OPERATIVE PROCEDURE:  The patient was brought to the operating room and  induced under general endotracheal anesthesia.  The patient was placed in 10  pounds of halter traction.  The neck was prepped with Betadine soap and  solution and  draped in a sterile fashion.  An oblique incision was made in  the left side of the neck paralleling the anterior border of the left  sternocleidomastoid after it was instilled with local anesthetic with  epinephrine.  The incision was made and carried down through subcutaneous  tissue.  Bipolar cautery and electrocautery were used to maintain  hemostasis.  Dissection was carried down to the platysma which was divided,  and then dissection was carried out to the avascular plane under the  sternocleidomastoid.  The carotid artery was gently retracted laterally and  the trachea and esophagus medially.  The ventral aspect of the vertebral  column was identified and localized.  The C3-4, C4-5, C5-6,and C6-7  intervertebral disk spaces were identified.  An x-ray was taken to confirm  the localization at each level.  The annulus was incised.  The annulus, as  well as the anterior longitudinal ligament was quite spondylotic at the 4-5,  5-6 and 6-7 levels.  The microscope was draped and brought into the  field to  provide magnification, illumination, and visualization, and the remainder of  the diskectomy was performed using microdissection and microsurgical  technique.  At each level, the cauterized endplates of the corresponding  vertebra were removed using microcurettes as well as the micro-Max drill.  Again at each level, posterior spondylotic overgrowth was removed using the  micro-Max drill as well as a 2-mm Kerrison punch with a thin footplate.  Dissection was carried out laterally to decompress the nerve roots within  the foramina, and at each level, good decompression of the spinal canal and  thecal sac was achieved.  Once the decompression was completed, hemostasis  was established with the use of Gelfoam soaked in thrombin.  We then  proceeded with the arthrodesis.  Wedges of allograft were placed at each  intervertebral disk space level.  A 7-mm graft at the C3-4 level, a 5-mm  graft at  the C4-5 level, and a 6-mm graft at both the C5-6 and C6-7.  All of  the grafts were countersunk, and then we selected the 82.5-mm Premier  cervical plate.  It was secured to each of the vertebra with a pair of  screws, 14 mm at the C3, C4, C6 and C7 levels and 13 mm in length at the C5  level.  Each screw hole was drilled.  A screw that was self tapping was  placed.  All 10 screws were fully tightened.  An x-ray was taken which  showed the upper aspect of the fusion construct with the grafts in good  position at both C3-4 and C4-5 and the screws in good position at C3, C4,  and C5.  The locking system was then secured.  It was irrigated with  bacitracin solution.  Meticulous hemostasis was established and confirmed,  and then we proceeded with closure.  The platysma was closed with  interrupted 2-0 undyed Vicryl sutures.  The subcutaneous and subcuticular  closure with interrupted 3-0 undyed Vicryl suture.  The skin edges were  reapproximated with Steri-Strips and the wounds dressed with Adaptic and  sterile gauze.   The patient tolerated the procedure well.  The estimated blood loss was 150  cubic centimeters.  Sponge and needle counts were correct.  Following  surgery, the patient was reversed from anesthetic and extubated.  He was  placed in an Aspen cervical collar.  He was transferred to the recovery room  for further care where he was noted to be moving all four extremities to  command.                                                Hewitt Shorts, M.D.    RWN/MEDQ  D:  04/22/2002  T:  04/22/2002  Job:  [id number]

## 2010-10-20 ENCOUNTER — Other Ambulatory Visit: Payer: Self-pay | Admitting: Internal Medicine

## 2010-10-20 NOTE — Telephone Encounter (Signed)
Ok to refill x 5 

## 2010-10-20 NOTE — Telephone Encounter (Signed)
rx request for diazepam 5 mg; please advise

## 2010-10-20 NOTE — Telephone Encounter (Signed)
Notified [place] spoke with Nehemiah Settle ok for refill on diazepam. Updated EPIC.Marland KitchenMarland Kitchen7/20/12@3 :33pm/LMB

## 2010-11-06 ENCOUNTER — Other Ambulatory Visit: Payer: Self-pay | Admitting: *Deleted

## 2010-11-06 MED ORDER — HYDROCHLOROTHIAZIDE 25 MG PO TABS: 25.0000 mg | ORAL_TABLET | Freq: Every day | ORAL | Status: DC

## 2010-11-18 ENCOUNTER — Other Ambulatory Visit: Payer: Self-pay | Admitting: Internal Medicine

## 2011-01-02 LAB — POCT CARDIAC MARKERS
Myoglobin, poc: 263
Troponin i, poc: <0.05

## 2011-01-02 LAB — POCT I-STAT, CHEM 8
Calcium, Ion: 1.2
Chloride: 100
Glucose, Bld: 102 — ABNORMAL HIGH
HCT: 47
Hemoglobin: 16
Potassium: 3.2 — ABNORMAL LOW

## 2011-01-02 LAB — DIFFERENTIAL
Basophils Relative: 0
Eosinophils Relative: 1
Lymphocytes Relative: 13
Lymphs Abs: 2
Monocytes Absolute: 0.9
Monocytes Relative: 6
Neutro Abs: 12.5 — ABNORMAL HIGH

## 2011-01-02 LAB — CBC
Hemoglobin: 15.7
MCHC: 34.1
MCV: 93.9

## 2011-01-08 LAB — CBC
HCT: 44.6
Hemoglobin: 15.4
MCHC: 34.5
MCV: 92.8
Platelets: 270
RBC: 4.81
RDW: 12.6
WBC: 9.7

## 2011-01-08 LAB — BASIC METABOLIC PANEL
BUN: 15
CO2: 35 — ABNORMAL HIGH
Calcium: 9.9
Chloride: 98
Creatinine, Ser: 1.1
GFR calc Af Amer: >60
GFR calc non Af Amer: >60
Glucose, Bld: 109 — ABNORMAL HIGH
Potassium: 3.4 — ABNORMAL LOW
Sodium: 140

## 2011-01-08 LAB — TYPE AND SCREEN
ABO/RH(D): O POS
Antibody Screen: NEGATIVE

## 2011-02-06 ENCOUNTER — Other Ambulatory Visit (INDEPENDENT_AMBULATORY_CARE_PROVIDER_SITE_OTHER): Payer: Medicare Other

## 2011-02-06 ENCOUNTER — Ambulatory Visit (INDEPENDENT_AMBULATORY_CARE_PROVIDER_SITE_OTHER): Payer: Medicare Other | Admitting: Internal Medicine

## 2011-02-06 ENCOUNTER — Encounter: Payer: Self-pay | Admitting: Internal Medicine

## 2011-02-06 DIAGNOSIS — E663 Overweight: Secondary | ICD-10-CM

## 2011-02-06 DIAGNOSIS — R7989 Other specified abnormal findings of blood chemistry: Secondary | ICD-10-CM

## 2011-02-06 DIAGNOSIS — I10 Essential (primary) hypertension: Secondary | ICD-10-CM

## 2011-02-06 DIAGNOSIS — M5137 Other intervertebral disc degeneration, lumbosacral region: Secondary | ICD-10-CM

## 2011-02-06 DIAGNOSIS — Z0000 Encounter for general adult medical examination without abnormal findings: Secondary | ICD-10-CM

## 2011-02-06 LAB — HEPATIC FUNCTION PANEL
ALT: 30 U/L (ref 0–53)
AST: 33 U/L (ref 0–37)
Alkaline Phosphatase: 59 U/L (ref 39–117)
Bilirubin, Direct: 0.1 mg/dL (ref 0.0–0.3)
Total Bilirubin: 0.8 mg/dL (ref 0.3–1.2)

## 2011-02-06 LAB — COMPREHENSIVE METABOLIC PANEL
Alkaline Phosphatase: 59 U/L (ref 39–117)
BUN: 23 mg/dL (ref 6–23)
CO2: 34 mEq/L — ABNORMAL HIGH (ref 19–32)
Creatinine, Ser: 1.2 mg/dL (ref 0.4–1.5)
GFR: 62.98 mL/min (ref >60.00)
Glucose, Bld: 71 mg/dL (ref 70–99)
Total Bilirubin: 0.8 mg/dL (ref 0.3–1.2)

## 2011-02-06 NOTE — Progress Notes (Deleted)
  Subjective:    Patient ID: [name], male    DOB: [birth year], 75 y.o.   MRN: [id number]  HPI    Review of Systems     Objective:   Physical Exam        Assessment & Plan:

## 2011-02-06 NOTE — Progress Notes (Signed)
Subjective:    Patient ID: [name], male    DOB: Nov 11, 1935, 75 y.o.   MRN: [id number]  HPI  The patient is here for annual Medicare wellness examination and management of other chronic and acute problems. Feeling good with no active complaints. He saw a practitioner in [place] - an alternative practitioner who took blood and fat tissue making an emulsion then injected into the scar tissue in his back with good results.     The risk factors are reflected in the social history.  The roster of all physicians providing medical care to patient - is listed in the Snapshot section of the chart.  Activities of daily living:  The patient is 100% inedpendent in all ADLs: dressing, toileting, feeding as well as independent mobility  Home safety : The patient has smoke detectors in the home. They wear seatbelts.  Firearms are present in the home, kept in a safe fashion. There is no violence in the home.   There is no risks for hepatitis, STDs or HIV. There is no   history of blood transfusion. They have no travel history to infectious disease endemic areas of the world.  The patient has seen their dentist in the last six month. They have not seen their eye doctor in the last year. They deny any hearing difficulty and have not had audiologic testing in the last year.  They do not  have excessive sun exposure. Discussed the need for sun protection: hats, long sleeves and use of sunscreen if there is significant sun exposure.   Diet: the importance of a healthy diet is discussed. They do have a healthy diet, weight has been stable.  The patient has a regular exercise program: swimming and light weight lifting , 45 minute duration, 7 days per week.  The benefits of regular aerobic exercise were discussed.  Depression screen: there are no signs or vegative symptoms of depression- irritability, change in appetite, anhedonia, sadness/tearfullness.  Cognitive assessment: the patient manages all their  financial and personal affairs and is actively engaged. They could relate day,date,year and events; recalled 3/3 objects at 3 minutes; performed clock-face test normally.  The following portions of the patient's history were reviewed and updated as appropriate: allergies, current medications, past family history, past medical history,  past surgical history, past social history  and problem list.  Vision, hearing, body mass index were assessed and reviewed.   During the course of the visit the patient was educated and counseled about appropriate screening and preventive services including : fall prevention , diabetes screening, nutrition counseling, colorectal cancer screening, and recommended immunizations.  Past Medical History  Diagnosis Date  . CHOLELITHIASIS 12/17/2007  . COLONIC POLYPS, ADENOMATOUS, HX OF 12/17/2007  . HYPERTENSION 12/17/2007  . INTERNAL HEMORRHOIDS 12/17/2007  . Overweight 02/19/2008  . POLIOMYELITIS, HX OF 12/17/2007  . RENAL CYST 12/17/2007  . SHINGLES, HX OF 12/17/2007  . DEGENERATIVE DISC DISEASE, CERVICAL SPINE 02/19/2008  . DISC DISEASE, LUMBAR 12/17/2007   Past Surgical History  Procedure Date  . Hemorrhoid surgery   . Cervical fusion   . Bilateral knee surgery     arthroscopy bilateral - Dr. Thurston Hole  . Lumbar back surgery     two procedures: diskectomy and then a fusion L4-5, L5-S1 Dr. Newell Coral  . Tonsillectomy    No family history on file. History   Social History  . Marital Status: Married    Spouse Name: N/A    Number of Children: N/A  . Years  of Education: N/A   Occupational History  . 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    Social History Main Topics  . Smoking status: Former Smoker    Quit date: 05/03/2005  . Smokeless tobacco: Never Used  . Alcohol Use: Yes     rare drinker  . Drug Use: No  . Sexually Active: Yes -- Male partner(s)   Other Topics Concern  . Not on file   Social History Narrative   HSG,  [place]. Married in '64, 2 adopted sons: one in business with him, one as Conservator, museum/gallery; 1 grandchild. Moved to [place] in 1964. 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 Marriage in Good Health. ACP - Yes- CPR; no prolonged intubation; no heroic or futile measure in the face of poor prognosis.         Review of Systems Constitutional:  Negative for fever, chills, activity change and unexpected weight change.  HEENT:  Negative for hearing loss, ear pain, congestion, neck stiffness and postnasal drip. Negative for sore throat or swallowing problems. Negative for dental complaints.   Eyes: Negative for vision loss or change in visual acuity.  Respiratory: Negative for chest tightness and wheezing. Negative for DOE.   Cardiovascular: Negative for chest pain or palpitations. No decreased exercise tolerance Gastrointestinal: No change in bowel habit. No bloating or gas. No reflux or indigestion Genitourinary: Negative for urgency, frequency, flank pain and difficulty urinating.  Musculoskeletal: Negative for myalgias, back pain, arthralgias and gait problem.  Neurological: Negative for dizziness, tremors, weakness and headaches.  Hematological: Negative for adenopathy.  Psychiatric/Behavioral: Negative for behavioral problems and dysphoric mood.       Objective:   Physical Exam Vital signs reviewed Gen'l: Well nourished well developed white male in no acute distress  HEENT: Head: Normocephalic and atraumatic. Right Ear: External ear normal. EAC/TM nl. Left Ear: External ear normal.  EAC/TM nl. Nose: Nose normal. Mouth/Throat: Oropharynx is clear and moist. Dentition - upper partial, remaining native dentition ok. No buccal or palatal lesions. Posterior pharynx clear. Eyes: Conjunctivae and sclera clear. EOM intact. Pupils are equal, round, and reactive to light. Right eye exhibits no discharge. Left eye exhibits no discharge. Neck: Normal range of motion. Neck supple. No JVD  present. No tracheal deviation present. No thyromegaly present.  Cardiovascular: Normal rate, regular rhythm, no gallop, no friction rub, no murmur heard.      Quiet precordium. 2+ radial and DP pulses . No carotid bruits Pulmonary/Chest: Effort normal. No respiratory distress or increased WOB, no wheezes, no rales. No chest wall deformity or CVAT. Abdominal: Soft. Bowel sounds are normal in all quadrants. He exhibits no distension, no tenderness, no rebound or guarding, No heptosplenomegaly  Genitourinary: deferred   Musculoskeletal: Normal range of motion. He exhibits no edema and no tenderness.       Small and large joints without redness, synovial thickening or deformity. Full range of motion preserved about all small, median and large joints.  Lymphadenopathy:    He has no cervical or supraclavicular adenopathy.  Neurological: He is alert and oriented to person, place, and time. CN II-XII intact. DTRs 2+ and symmetrical biceps, radial and patellar tendons. Cerebellar function normal with no tremor, rigidity, normal gait and station.  Skin: Skin is warm and dry. No rash noted. No erythema.  Psychiatric: He has a normal mood and affect. His behavior is normal. Thought content normal.   Lab Results  Component Value Date   WBC 5.1 02/01/2010  HGB 14.8 02/01/2010   HCT 42.5 02/01/2010   PLT 195.0 02/01/2010   GLUCOSE 71 02/06/2011   CHOL 174 02/01/2010   TRIG 83.0 02/01/2010   HDL 56.40 02/01/2010   LDLDIRECT 119.5 09/24/2006   LDLCALC 101* 02/01/2010   ALT 30 02/06/2011   ALT 30 02/06/2011   AST 33 02/06/2011   AST 33 02/06/2011   NA 143 02/06/2011   K 3.7 02/06/2011   CL 103 02/06/2011   CREATININE 1.2 02/06/2011   BUN 23 02/06/2011   CO2 34* 02/06/2011   TSH 1.20 02/01/2010   PSA 1.26 02/01/2010            Assessment & Plan:

## 2011-02-07 NOTE — Assessment & Plan Note (Addendum)
[name] continues to exercise on a regular basis, watches his diet and has had slow weight loss. He has brought his BMI down from 34.7 to 27.8 (goal 25 or less)  GREAT WORK.  Plan - continued weight management: smart food choices, PORTION SIZE CONTROL - hand as a guide, continues exercise.            Target weight - 185, goal - continue to loose 1 lb per month.

## 2011-02-07 NOTE — Assessment & Plan Note (Signed)
BP Readings from Last 3 Encounters:  02/06/11 120/78  02/01/10 120/70  03/07/09 122/84   Excellent control!

## 2011-02-07 NOTE — Assessment & Plan Note (Signed)
Stable. Chronic loss of sensation and reflexes right lower extremity. Tissue injections in [place] - seem to have have helped with the pain.

## 2011-02-07 NOTE — Assessment & Plan Note (Signed)
Interval history most notable for tissue injections to spinal area, otherwise unremarkable. Physical exam is normal. Lab - lipid panels normal '10, '11 - no indication for repeat this year. General chemistries are normal. He is current with colorectal cancer screening with last study March '11. Immunizations: Tdap Feb '08; Pneumonia vacccine Nov '11. Exempted from shingles vaccine due to previous infection.  In summary - a very nice man who is medically stable and doing well. 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 He will return as needed or in one year.

## 2011-02-11 ENCOUNTER — Encounter: Payer: Self-pay | Admitting: Internal Medicine

## 2011-03-12 ENCOUNTER — Telehealth: Payer: Self-pay | Admitting: Internal Medicine

## 2011-03-12 NOTE — Telephone Encounter (Signed)
Called pt and he is thankful for the info!   Will print and mail out labs.    Thanks!

## 2011-03-12 NOTE — Telephone Encounter (Signed)
Patient asking about liver functions. Looks like note from visit completed and should have been mailed to him (if not mailed please mail). His liver functions were normal. Please call to let him know. THANKS

## 2011-04-18 ENCOUNTER — Other Ambulatory Visit: Payer: Self-pay | Admitting: Internal Medicine

## 2011-04-18 NOTE — Telephone Encounter (Signed)
Refill request for valium 5mg . #30 with 5 refills. Last refilled on 7.20.12. OK to refill?

## 2011-05-21 ENCOUNTER — Other Ambulatory Visit: Payer: Self-pay | Admitting: Internal Medicine

## 2011-10-14 ENCOUNTER — Other Ambulatory Visit: Payer: Self-pay | Admitting: Internal Medicine

## 2011-10-15 ENCOUNTER — Other Ambulatory Visit: Payer: Self-pay | Admitting: Internal Medicine

## 2011-10-16 ENCOUNTER — Other Ambulatory Visit: Payer: Self-pay | Admitting: *Deleted

## 2011-10-16 NOTE — Telephone Encounter (Signed)
Refill on med valium faxed and called to [facility]

## 2011-11-14 ENCOUNTER — Other Ambulatory Visit: Payer: Self-pay | Admitting: Internal Medicine

## 2012-03-06 ENCOUNTER — Encounter: Payer: Medicare Other | Admitting: Internal Medicine

## 2012-04-08 ENCOUNTER — Encounter: Payer: Medicare Other | Admitting: Internal Medicine

## 2012-04-14 ENCOUNTER — Other Ambulatory Visit: Payer: Self-pay | Admitting: *Deleted

## 2012-04-14 MED ORDER — DIAZEPAM 5 MG PO TABS: 5.0000 mg | ORAL_TABLET | Freq: Two times a day (BID) | ORAL | Status: DC | PRN

## 2012-04-14 NOTE — Telephone Encounter (Signed)
Last office visit 02/06/11.

## 2012-05-16 ENCOUNTER — Other Ambulatory Visit: Payer: Self-pay | Admitting: Internal Medicine

## 2012-05-21 ENCOUNTER — Encounter: Payer: Self-pay | Admitting: Internal Medicine

## 2012-05-21 ENCOUNTER — Ambulatory Visit (INDEPENDENT_AMBULATORY_CARE_PROVIDER_SITE_OTHER): Payer: Medicare Other | Admitting: Internal Medicine

## 2012-05-21 ENCOUNTER — Other Ambulatory Visit (INDEPENDENT_AMBULATORY_CARE_PROVIDER_SITE_OTHER): Payer: Medicare Other

## 2012-05-21 VITALS — BP 114/70 | HR 62 | Temp 97.7°F | Resp 12 | Ht 68.0 in | Wt 185.0 lb

## 2012-05-21 DIAGNOSIS — I10 Essential (primary) hypertension: Secondary | ICD-10-CM

## 2012-05-21 DIAGNOSIS — Z0000 Encounter for general adult medical examination without abnormal findings: Secondary | ICD-10-CM

## 2012-05-21 DIAGNOSIS — M5137 Other intervertebral disc degeneration, lumbosacral region: Secondary | ICD-10-CM

## 2012-05-21 DIAGNOSIS — E663 Overweight: Secondary | ICD-10-CM

## 2012-05-21 DIAGNOSIS — K648 Other hemorrhoids: Secondary | ICD-10-CM

## 2012-05-21 DIAGNOSIS — M5030 Other cervical disc degeneration, unspecified cervical region: Secondary | ICD-10-CM

## 2012-05-21 LAB — LIPID PANEL
Cholesterol: 174 mg/dL (ref 0–200)
HDL: 64.5 mg/dL
LDL Cholesterol: 84 mg/dL (ref 0–99)
Total CHOL/HDL Ratio: 3
Triglycerides: 130 mg/dL (ref 0.0–149.0)
VLDL: 26 mg/dL (ref 0.0–40.0)

## 2012-05-21 LAB — COMPREHENSIVE METABOLIC PANEL
ALT: 30 U/L (ref 0–53)
AST: 33 U/L (ref 0–37)
Albumin: 3.8 g/dL (ref 3.5–5.2)
Alkaline Phosphatase: 73 U/L (ref 39–117)
BUN: 25 mg/dL — ABNORMAL HIGH (ref 6–23)
Potassium: 3.2 mEq/L — ABNORMAL LOW (ref 3.5–5.1)
Sodium: 140 mEq/L (ref 135–145)

## 2012-05-21 MED ORDER — HYDROCHLOROTHIAZIDE 25 MG PO TABS: 25.0000 mg | ORAL_TABLET | Freq: Every day | ORAL | Status: DC

## 2012-05-21 NOTE — Progress Notes (Signed)
Subjective:    Patient ID: [name], male    DOB: 10-Sep-1935, 77 y.o.   MRN: [id number]  HPI The patient is here for annual Medicare wellness examination and management of other chronic and acute problems.  He has been healthy with no major medical problems, surgery, injuries. He has been working out daily - swimming, doing weight work twice a week. Feeling good.    The risk factors are reflected in the social history.  The roster of all physicians providing medical care to patient - is listed in the Snapshot section of the chart.  Activities of daily living:  The patient is 100% inedpendent in all ADLs: dressing, toileting, feeding as well as independent mobility  Home safety : The patient has smoke detectors in the home.Fall -no falls. Home is fall safe.  They wear seatbelts. firearms are present in the home, kept in a safe fashion. There is no violence in the home.   There is no risks for hepatitis, STDs or HIV. There is no history of blood transfusion. They have no travel history to infectious disease endemic areas of the world.  The patient has seen their dentist in the last six month. They have seen their eye doctor in the last year. They deny any hearing difficulty and have not had audiologic testing in the last year.    They do not  have excessive sun exposure. Discussed the need for sun protection: hats, long sleeves and use of sunscreen if there is significant sun exposure.   Diet: the importance of a healthy diet is discussed. They do have a healthy diet.  The patient has a regular exercise program: swimming/weight work , 60 min duration, 5 per week.  The benefits of regular aerobic exercise were discussed.  Depression screen: there are no signs or vegative symptoms of depression- irritability, change in appetite, anhedonia, sadness/tearfullness.  Cognitive assessment: the patient manages all their financial and personal affairs and is actively engaged.   The  following portions of the patient's history were reviewed and updated as appropriate: allergies, current medications, past family history, past medical history,  past surgical history, past social history  and problem list.  Past Medical History  Diagnosis Date  . CHOLELITHIASIS 12/17/2007  . COLONIC POLYPS, ADENOMATOUS, HX OF 12/17/2007  . HYPERTENSION 12/17/2007  . INTERNAL HEMORRHOIDS 12/17/2007  . Overweight 02/19/2008  . POLIOMYELITIS, HX OF 12/17/2007  . RENAL CYST 12/17/2007  . SHINGLES, HX OF 12/17/2007  . DEGENERATIVE DISC DISEASE, CERVICAL SPINE 02/19/2008  . DISC DISEASE, LUMBAR 12/17/2007   Past Surgical History  Procedure Laterality Date  . Hemorrhoid surgery    . Cervical fusion    . Bilateral knee surgery      arthroscopy bilateral - Dr. Thurston Hole  . Lumbar back surgery      two procedures: diskectomy and then a fusion L4-5, L5-S1 Dr. Newell Coral  . Tonsillectomy     Family History  Problem Relation Age of Onset  . Cancer Neg Hx   . Diabetes Neg Hx   . Heart disease Neg Hx   . Hyperlipidemia Neg Hx    History   Social History  . Marital Status: Married    Spouse Name: N/A    Number of Children: N/A  . Years of Education: N/A   Occupational History  . 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    Social History Main Topics  . Smoking status: Former Smoker    Quit date: 05/03/2005  .  Smokeless tobacco: Never Used  . Alcohol Use: Yes     Comment: rare drinker  . Drug Use: No  . Sexually Active: Yes -- Male partner(s)   Other Topics Concern  . Not on file   Social History Narrative   HSG, [place]. Married in '64, 2 adopted sons: one in business with him, one as Conservator, museum/gallery; 1 grandchild. Moved to [place] in 1964. 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 Marriage in Good Health. ACP - Yes- CPR; no prolonged intubation; no heroic or futile measure in the face of poor prognosis.    Current Outpatient Prescriptions on File  Prior to Visit  Medication Sig Dispense Refill  . aspirin 81 MG tablet Take 81 mg by mouth daily.        . diazepam (VALIUM) 5 MG tablet Take 1 tablet (5 mg total) by mouth every 12 (twelve) hours as needed for anxiety.  30 tablet  1  . diphenhydrAMINE (BENADRYL) 25 MG tablet 2 by mouth at bedtime       . K-DUR 20 MEQ tablet TAKE 1 TABLET EACH DAY.  30 each  6  . Multiple Vitamin (MULTIVITAMIN) capsule Take 1 capsule by mouth daily.        Marland Kitchen VALIUM 5 MG tablet TAKE ONE TABLET AT BEDTIME.  30 each  5   No current facility-administered medications on file prior to visit.      Vision, hearing, body mass index were assessed and reviewed.   During the course of the visit the patient was educated and counseled about appropriate screening and preventive services including : fall prevention , diabetes screening, nutrition counseling, colorectal cancer screening, and recommended immunizations.    Review of Systems Constitutional:  Negative for fever, chills, activity change and unexpected weight change.  HEENT:  Negative for hearing loss, ear pain, congestion, neck stiffness and postnasal drip. Negative for sore throat or swallowing problems. Negative for dental complaints.   Eyes: Negative for vision loss or change in visual acuity.  Respiratory: Negative for chest tightness and wheezing. Negative for DOE.   Cardiovascular: Negative for chest pain or palpitations. No decreased exercise tolerance Gastrointestinal: No change in bowel habit. No bloating or gas. No reflux or indigestion Genitourinary: Negative for urgency, frequency, flank pain and difficulty urinating.  Musculoskeletal: Negative for myalgias, back pain, arthralgias and gait problem.  Neurological: Negative for dizziness, tremors, weakness and headaches.  Hematological: Negative for adenopathy.  Psychiatric/Behavioral: Negative for behavioral problems and dysphoric mood.       Objective:   Physical Exam .        Assessment & Plan:

## 2012-05-21 NOTE — Patient Instructions (Addendum)
Thanks for coming to see me.  Your exam is normal. Will get labs today. Results will be in the report I send you and available on MyChart.   You should come back in 18-24 months or sooner as needed.

## 2012-05-22 ENCOUNTER — Encounter: Payer: Self-pay | Admitting: Internal Medicine

## 2012-05-22 NOTE — Assessment & Plan Note (Signed)
Body mass index is 28.14 kg/(m^2).  Good job of weight management - needs to continue efforts in regard to calorie restriction along with continuing his active work-out program

## 2012-05-22 NOTE — Assessment & Plan Note (Signed)
BP Readings from Last 3 Encounters:  05/21/12 114/70  02/06/11 120/78  02/01/10 120/70   Great control on minimal medication. Kidney function and electrolytes are normal  Plan  Continue present regimen

## 2012-05-22 NOTE — Assessment & Plan Note (Signed)
Markedly improved since he has been using senna every night giving a regular, easy bowel habit.

## 2012-05-22 NOTE — Assessment & Plan Note (Signed)
Interval history is negative for any major illness, surgery or injury. Physical exam is normal. Labs reviewed - basically normal. He is current with colorectal cancer screening and has aged out of prostate cancer screening. Immunizations are up to date.  In summary - a very nice man who is medically stable and doing well. He is encouraged to continue his healthy life-style. Return visit in 1 year for quick BP check and lab, return in 18-24 for complete exam.

## 2012-05-22 NOTE — Assessment & Plan Note (Signed)
Stable w/o limitation in activity. He avoids high impact activity.

## 2012-05-22 NOTE — Assessment & Plan Note (Signed)
Doing well with good range of motion cervical spine. No radicular symptoms

## 2012-05-25 ENCOUNTER — Encounter: Payer: Self-pay | Admitting: Internal Medicine

## 2012-05-25 NOTE — Progress Notes (Signed)
  Subjective:    Patient ID: [name], male    DOB: Dec 08, 1935, 77 y.o.   MRN: [id number]  HPI    Review of Systems     Objective:   Physical Exam Filed Vitals:   05/21/12 1332  BP: 114/70  Pulse: 62  Temp: 97.7 F (36.5 C)  Resp: 12   Wt Readings from Last 3 Encounters:  05/21/12 185 lb (83.915 kg)  02/06/11 194 lb (87.998 kg)  02/01/10 193 lb (87.544 kg)   Gen'l: Well nourished well developed white male in no acute distress  HEENT: Head: Normocephalic and atraumatic. Right Ear: External ear normal. EAC/TM nl. Left Ear: External ear normal.  EAC/TM nl. Nose: Nose normal. Mouth/Throat: Oropharynx is clear and moist. Dentition - native, in good repair. No buccal or palatal lesions. Posterior pharynx clear. Eyes: Conjunctivae and sclera clear. EOM intact. Pupils are equal, round, and reactive to light. Right eye exhibits no discharge. Left eye exhibits no discharge. Neck: Normal range of motion. Neck supple. No JVD present. No tracheal deviation present. No thyromegaly present.  Cardiovascular: Normal rate, regular rhythm, no gallop, no friction rub, no murmur heard.      Quiet precordium. 2+ radial and DP pulses . No carotid bruits Pulmonary/Chest: Effort normal. No respiratory distress or increased WOB, no wheezes, no rales. No chest wall deformity or CVAT. Abdomen: Soft. Bowel sounds are normal in all quadrants. He exhibits no distension, no tenderness, no rebound or guarding, No heptosplenomegaly  Genitourinary:  deferred Musculoskeletal: Normal range of motion. He exhibits no edema and no tenderness.       Small and large joints without redness, synovial thickening or deformity. Full range of motion preserved about all small, median and large joints.  Lymphadenopathy:    He has no cervical or supraclavicular adenopathy.  Neurological: He is alert and oriented to person, place, and time. CN II-XII intact. DTRs 2+ and symmetrical biceps, radial and patellar tendons.  Cerebellar function normal with no tremor, rigidity, normal gait and station.  Skin: Skin is warm and dry. No rash noted. No erythema.  Psychiatric: He has a normal mood and affect. His behavior is normal. Thought content normal.   Lab Results  Component Value Date   WBC 5.1 02/01/2010   HGB 14.8 02/01/2010   HCT 42.5 02/01/2010   PLT 195.0 02/01/2010   GLUCOSE 85 05/21/2012   CHOL 174 05/21/2012   TRIG 130.0 05/21/2012   HDL 64.50 05/21/2012   LDLDIRECT 119.5 09/24/2006   LDLCALC 84 05/21/2012   ALT 30 05/21/2012   AST 33 05/21/2012   NA 140 05/21/2012   K 3.2* 05/21/2012   CL 99 05/21/2012   CREATININE 1.1 05/21/2012   BUN 25* 05/21/2012   CO2 33* 05/21/2012   TSH 1.20 02/01/2010   PSA 1.26 02/01/2010           Assessment & Plan:

## 2012-06-12 ENCOUNTER — Other Ambulatory Visit: Payer: Self-pay

## 2012-06-12 ENCOUNTER — Other Ambulatory Visit: Payer: Self-pay | Admitting: Internal Medicine

## 2012-06-12 MED ORDER — DIAZEPAM 5 MG PO TABS: 5.0000 mg | ORAL_TABLET | Freq: Two times a day (BID) | ORAL | Status: DC | PRN

## 2012-06-13 NOTE — Telephone Encounter (Signed)
Diazepam called to [facility]

## 2012-08-14 ENCOUNTER — Other Ambulatory Visit: Payer: Self-pay

## 2012-08-14 MED ORDER — DIAZEPAM 5 MG PO TABS: 5.0000 mg | ORAL_TABLET | Freq: Two times a day (BID) | ORAL | Status: DC | PRN

## 2012-08-14 NOTE — Telephone Encounter (Signed)
Diazepam called to [facility]

## 2012-08-28 ENCOUNTER — Encounter: Payer: Self-pay | Admitting: Internal Medicine

## 2012-08-28 NOTE — Telephone Encounter (Signed)
[id number]. Not use why patient would need it

## 2012-09-14 ENCOUNTER — Encounter: Payer: Self-pay | Admitting: Internal Medicine

## 2012-09-15 ENCOUNTER — Other Ambulatory Visit: Payer: Self-pay

## 2012-09-15 ENCOUNTER — Other Ambulatory Visit: Payer: Self-pay | Admitting: Internal Medicine

## 2012-09-15 MED ORDER — DIAZEPAM 5 MG PO TABS: 5.0000 mg | ORAL_TABLET | Freq: Two times a day (BID) | ORAL | Status: DC | PRN

## 2012-09-15 MED ORDER — DIPHENOXYLATE-ATROPINE 2.5-0.025 MG PO TABS: 1.0000 | ORAL_TABLET | Freq: Four times a day (QID) | ORAL | Status: DC | PRN

## 2012-09-15 NOTE — Telephone Encounter (Signed)
Diazepam called to pharmacy 

## 2012-10-13 ENCOUNTER — Other Ambulatory Visit: Payer: Self-pay

## 2012-10-13 MED ORDER — DIAZEPAM 5 MG PO TABS: ORAL_TABLET | ORAL | Status: DC

## 2012-10-14 NOTE — Telephone Encounter (Signed)
Diazepam called to pharmacy 

## 2012-12-02 ENCOUNTER — Encounter: Payer: Self-pay | Admitting: Internal Medicine

## 2012-12-06 ENCOUNTER — Other Ambulatory Visit: Payer: Self-pay | Admitting: Internal Medicine

## 2013-02-05 ENCOUNTER — Other Ambulatory Visit: Payer: Self-pay

## 2013-04-27 ENCOUNTER — Encounter: Payer: Self-pay | Admitting: Internal Medicine

## 2013-05-06 ENCOUNTER — Ambulatory Visit (INDEPENDENT_AMBULATORY_CARE_PROVIDER_SITE_OTHER): Payer: Medicare Other | Admitting: Internal Medicine

## 2013-05-06 ENCOUNTER — Other Ambulatory Visit (INDEPENDENT_AMBULATORY_CARE_PROVIDER_SITE_OTHER): Payer: Medicare Other

## 2013-05-06 ENCOUNTER — Encounter: Payer: Self-pay | Admitting: Internal Medicine

## 2013-05-06 VITALS — BP 148/92 | HR 56 | Temp 97.1°F | Ht 69.5 in | Wt 190.2 lb

## 2013-05-06 DIAGNOSIS — M5030 Other cervical disc degeneration, unspecified cervical region: Secondary | ICD-10-CM

## 2013-05-06 DIAGNOSIS — I10 Essential (primary) hypertension: Secondary | ICD-10-CM

## 2013-05-06 DIAGNOSIS — Z0000 Encounter for general adult medical examination without abnormal findings: Secondary | ICD-10-CM

## 2013-05-06 DIAGNOSIS — Z23 Encounter for immunization: Secondary | ICD-10-CM

## 2013-05-06 DIAGNOSIS — M5137 Other intervertebral disc degeneration, lumbosacral region: Secondary | ICD-10-CM

## 2013-05-06 LAB — BASIC METABOLIC PANEL
BUN: 25 mg/dL — ABNORMAL HIGH (ref 6–23)
CALCIUM: 9.5 mg/dL (ref 8.4–10.5)
CO2: 33 meq/L — AB (ref 19–32)
Chloride: 97 mEq/L (ref 96–112)
Creatinine, Ser: 1.1 mg/dL (ref 0.4–1.5)
GFR: 66.41 mL/min (ref >60.00)
Glucose, Bld: 97 mg/dL (ref 70–99)
Potassium: 3.6 mEq/L (ref 3.5–5.1)
SODIUM: 139 meq/L (ref 135–145)

## 2013-05-06 NOTE — Patient Instructions (Signed)
Thanks for coming in and thank you for your trust and confidence over the years I have been working with you.  Your exam is great - a young man's exam  Lab today - results will be posted to MyChart  Immunizations - please check on Shingles vaccine coverage - can have in 2 months. Will give prevnar today - a companion pneumonia vaccine.

## 2013-05-06 NOTE — Progress Notes (Signed)
Subjective:    Patient ID: [name], male    DOB: 1935-09-23, 78 y.o.   MRN: [id number]  HPI The patient is here for annual Medicare wellness examination and management of other chronic and acute problems.  Interval history - ZERO - feels great.   The risk factors are reflected in the social history.  The roster of all physicians providing medical care to patient - is listed in the Snapshot section of the chart.  Activities of daily living:  The patient is 100% inedpendent in all ADLs: dressing, toileting, feeding as well as independent mobility  Home safety : The patient has smoke detectors in the home. They wear seatbelts. firearms are present in the home, kept in a safe fashion. There is no violence in the home.   There is no risks for hepatitis, STDs or HIV. There is no  history of blood transfusion. They have no travel history to infectious disease endemic areas of the world.  The patient has seen their dentist in the last six month. They have not seen their eye doctor in the last year. They deny any hearing difficulty and have not had audiologic testing in the last year.    They do not  have excessive sun exposure. Discussed the need for sun protection: hats, long sleeves and use of sunscreen if there is significant sun exposure.   Diet: the importance of a healthy diet is discussed. They do have a healthy diet.  The patient has a regular exercise program: swim, weight training ,  60 duration, 7 per week.  The benefits of regular aerobic exercise were discussed.  Depression screen: there are no signs or vegative symptoms of depression- irritability, change in appetite, anhedonia, sadness/tearfullness.  Cognitive assessment: the patient manages all their financial and personal affairs and is actively engaged.   The following portions of the patient's history were reviewed and updated as appropriate: allergies, current medications, past family history, past medical  history,  past surgical history, past social history  and problem list.  Vision, hearing, body mass index were assessed and reviewed.   During the course of the visit the patient was educated and counseled about appropriate screening and preventive services including : fall prevention , diabetes screening, nutrition counseling, colorectal cancer screening, and recommended immunizations.  Past Medical History  Diagnosis Date  . CHOLELITHIASIS 12/17/2007  . COLONIC POLYPS, ADENOMATOUS, HX OF 12/17/2007  . HYPERTENSION 12/17/2007  . INTERNAL HEMORRHOIDS 12/17/2007  . Overweight 02/19/2008  . POLIOMYELITIS, HX OF 12/17/2007  . RENAL CYST 12/17/2007  . SHINGLES, HX OF 12/17/2007  . DEGENERATIVE DISC DISEASE, CERVICAL SPINE 02/19/2008  . [place] DISEASE, LUMBAR 12/17/2007   Past Surgical History  Procedure Laterality Date  . Hemorrhoid surgery    . Cervical fusion    . Bilateral knee surgery      arthroscopy bilateral - Dr. Noemi Chapel  . Lumbar back surgery      two procedures: diskectomy and then a fusion L4-5, L5-S1 Dr. Sherwood Gambler  . Tonsillectomy     Family History  Problem Relation Age of Onset  . Cancer Neg Hx   . Diabetes Neg Hx   . Heart disease Neg Hx   . Hyperlipidemia Neg Hx    History   Social History  . Marital Status: Married    Spouse Name: N/A    Number of Children: 2  . Years of Education: 16   Occupational History  . 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  Social History Main Topics  . Smoking status: Former Smoker    Quit date: 05/03/2005  . Smokeless tobacco: Never Used  . Alcohol Use: Yes     Comment: rare drinker  . Drug Use: No  . Sexual Activity: Yes    Partners: Female   Other Topics Concern  . Not on file   Social History Narrative   HSG, [place]. Married in '64, 2 adopted sons: one in business with him, one as Engineer, structural; 2 grandchild. Moved to [place] in 1964. 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  Marriage in [place]. ACP - Yes- CPR; no prolonged intubation; no heroic or futile measure in the face of poor prognosis.     Current Outpatient Prescriptions on File Prior to Visit  Medication Sig Dispense Refill  . aspirin 81 MG tablet Take 81 mg by mouth daily.        . diazepam (VALIUM) 5 MG tablet TAKE ONE TABLET AT BEDTIME.  39 tablet  5  . diphenhydrAMINE (BENADRYL) 25 MG tablet 2 by mouth at bedtime       . hydrochlorothiazide (HYDRODIURIL) 25 MG tablet Take 1 tablet (25 mg total) by mouth daily.  90 tablet  3  . Multiple Vitamin (MULTIVITAMIN) capsule Take 1 capsule by mouth daily.        . potassium chloride SA (K-DUR,KLOR-CON) 20 MEQ tablet TAKE 1 TABLET EACH DAY.  30 tablet  5   No current facility-administered medications on file prior to visit.      Review of Systems Constitutional:  Negative for fever, chills, activity change and unexpected weight change.  HEENT:  Negative for hearing loss, ear pain, congestion, neck stiffness and postnasal drip. Negative for sore throat or swallowing problems. Negative for dental complaints.   Eyes: Negative for vision loss or change in visual acuity.  Respiratory: Negative for chest tightness and wheezing. Negative for DOE.   Cardiovascular: Negative for chest pain or palpitations. No decreased exercise tolerance Gastrointestinal: No change in bowel habit. No bloating or gas. No reflux or indigestion Genitourinary: Negative for urgency, frequency, flank pain and difficulty urinating.  Musculoskeletal: Negative for myalgias, back pain, arthralgias and gait problem.  Neurological: Negative for dizziness, tremors, weakness and headaches.  Hematological: Negative for adenopathy.  Psychiatric/Behavioral: Negative for behavioral problems and dysphoric mood.       Objective:   Physical Exam Filed Vitals:   05/06/13 1008  BP: 148/92  Pulse: 56  Temp: 97.1 F (36.2 C)   Wt Readings from Last 3 Encounters:  05/06/13 190 lb 3.2 oz  (86.274 kg)  05/21/12 185 lb (83.915 kg)  02/06/11 194 lb (87.998 kg)   Gen'l: Well nourished well developed male in no acute distress  HEENT: Head: Normocephalic and atraumatic. Right Ear: External ear normal. EAC/TM nl. Left Ear: External ear normal.  EAC/TM nl. Nose: Nose normal. Mouth/Throat: Oropharynx is clear and moist. Dentition - native, in good repair. No buccal or palatal lesions. Posterior pharynx clear. Eyes: Conjunctivae and sclera clear. EOM intact. Pupils are equal, round, and reactive to light. Right eye exhibits no discharge. Left eye exhibits no discharge. Neck: Normal range of motion. Neck supple. No JVD present. No tracheal deviation present. No thyromegaly present.  Cardiovascular: Normal rate, regular rhythm, no gallop, no friction rub, no murmur heard.      Quiet precordium. 2+ radial and DP pulses . No carotid bruits Pulmonary/Chest: Effort normal. No respiratory distress or increased WOB, no wheezes, no rales. No chest  wall deformity or CVAT. Abdomen: Soft. Bowel sounds are normal in all quadrants. He exhibits no distension, no tenderness, no rebound or guarding, No heptosplenomegaly  Genitourinary:  deferred Musculoskeletal: Normal range of motion. He exhibits no edema and no tenderness.       Small and large joints without redness, synovial thickening or deformity. Full range of motion preserved about all small, median and large joints.  Lymphadenopathy:    He has no cervical or supraclavicular adenopathy.  Neurological: He is alert and oriented to person, place, and time. CN II-XII intact. DTRs 2+ and symmetrical biceps, radial and patellar tendons. Cerebellar function normal with no tremor, rigidity, normal gait and station.  Skin: Skin is warm and dry. No rash noted. No erythema.  Psychiatric: He has a normal mood and affect. His behavior is normal. Thought content normal.   Labs pending      Assessment & Plan:

## 2013-05-06 NOTE — Progress Notes (Signed)
Pre visit review using our clinic review tool, if applicable. No additional management support is needed unless otherwise documented below in the visit note. 

## 2013-05-07 ENCOUNTER — Encounter: Payer: Self-pay | Admitting: Internal Medicine

## 2013-05-07 LAB — TSH: TSH: 1.84 u[IU]/mL (ref 0.35–5.50)

## 2013-05-07 NOTE — Assessment & Plan Note (Signed)
BP Readings from Last 3 Encounters:  05/06/13 148/92  05/21/12 114/70  02/06/11 120/78   Good control on diuretic alone.  Plan Continue present regimen

## 2013-05-07 NOTE — Assessment & Plan Note (Signed)
No complaint of back pain that limits his activity. He does swim regularly.

## 2013-05-07 NOTE — Assessment & Plan Note (Signed)
Stable and doing well. Swimming every day.

## 2013-05-07 NOTE — Assessment & Plan Note (Signed)
Interval history benign. Physical exam normal - he appears fit. Lab ordered and pending. He is current for colorectal cancer screening; aged out of prostate screening. Immunizations brought up to date with Prevnar 13 today.  In summary A great guy who is medically stable. He will return in 1 year or sooner as needed.

## 2013-05-12 ENCOUNTER — Other Ambulatory Visit: Payer: Self-pay

## 2013-05-12 ENCOUNTER — Encounter: Payer: Self-pay | Admitting: Internal Medicine

## 2013-05-12 MED ORDER — DIAZEPAM 5 MG PO TABS: ORAL_TABLET | ORAL | Status: DC

## 2013-05-28 ENCOUNTER — Encounter: Payer: Self-pay | Admitting: Internal Medicine

## 2013-05-28 DIAGNOSIS — Z87891 Personal history of nicotine dependence: Secondary | ICD-10-CM

## 2013-06-04 ENCOUNTER — Ambulatory Visit (INDEPENDENT_AMBULATORY_CARE_PROVIDER_SITE_OTHER)
Admission: RE | Admit: 2013-06-04 | Discharge: 2013-06-04 | Disposition: A | Discharge status: Home / Self Care | Payer: Medicare Other | Source: Ambulatory Visit | Attending: Internal Medicine | Admitting: Internal Medicine

## 2013-06-04 DIAGNOSIS — Z87891 Personal history of nicotine dependence: Secondary | ICD-10-CM

## 2013-06-09 ENCOUNTER — Other Ambulatory Visit: Payer: Self-pay | Admitting: Internal Medicine

## 2013-06-17 ENCOUNTER — Other Ambulatory Visit: Payer: Self-pay

## 2013-06-17 MED ORDER — HYDROCHLOROTHIAZIDE 25 MG PO TABS: 25.0000 mg | ORAL_TABLET | Freq: Every day | ORAL | Status: DC

## 2013-06-18 ENCOUNTER — Encounter: Payer: Self-pay | Admitting: Internal Medicine

## 2013-06-18 MED ORDER — MOXIFLOXACIN HCL 400 MG PO TABS: 400.0000 mg | ORAL_TABLET | Freq: Every day | ORAL | Status: DC

## 2013-08-31 ENCOUNTER — Encounter: Payer: Self-pay | Admitting: Podiatry

## 2013-08-31 ENCOUNTER — Ambulatory Visit (INDEPENDENT_AMBULATORY_CARE_PROVIDER_SITE_OTHER): Payer: Medicare Other

## 2013-08-31 ENCOUNTER — Ambulatory Visit (INDEPENDENT_AMBULATORY_CARE_PROVIDER_SITE_OTHER): Payer: Medicare Other | Admitting: Podiatry

## 2013-08-31 VITALS — BP 115/63 | HR 60 | Resp 16 | Ht 69.0 in | Wt 190.0 lb

## 2013-08-31 DIAGNOSIS — L84 Corns and callosities: Secondary | ICD-10-CM

## 2013-08-31 DIAGNOSIS — M779 Enthesopathy, unspecified: Secondary | ICD-10-CM

## 2013-08-31 MED ORDER — TRIAMCINOLONE ACETONIDE 10 MG/ML IJ SUSP
10.0000 mg | Freq: Once | INTRAMUSCULAR | Status: AC
  Administered 2013-08-31: 10 mg

## 2013-08-31 NOTE — Progress Notes (Signed)
   Subjective:    Patient ID: [name], male    DOB: 02/16/1936, 78 y.o.   MRN: [id number]  HPI Comments: Right foot little toe has a painful corn on it. Its been a couple of weeks. Used corn pads .      Review of Systems  All other systems reviewed and are negative.      Objective:   Physical Exam        Assessment & Plan:

## 2013-09-02 NOTE — Progress Notes (Signed)
Subjective:     Patient ID: [name], male   DOB: 12/01/1935, 78 y.o.   MRN: [id number]  HPI patient presents stating I'm having horrible pain between the fourth and fifth toe of my right foot that's been present for a while and has worsened over the last couple weeks. I've tried to trim it and soak it   Review of Systems  All other systems reviewed and are negative.      Objective:   Physical Exam  Nursing note and vitals reviewed. Constitutional: He is oriented to person, place, and time.  Cardiovascular: Intact distal pulses.   Musculoskeletal: Normal range of motion.  Neurological: He is oriented to person, place, and time.  Skin: Skin is warm.   neurovascular status is intact with range of motion of the subtalar and midtarsal joint within normal limits and no equinus condition noted. Digits are well perfused and arch height is normal upon weightbearing and I noted on the right foot there is keratotic lesion between the fourth and fifth toes with yellow discoloration with no drainage or erythema edema noted. The left shows a small amount of this deformity may not to the same degree     Assessment:     Probable interspace corn with possible overlying fungal infection along with inflammatory capsulitis of the fourth MPJ    Plan:     H&P and x-ray reviewed. Injected the first interspace 3 mg Kenalog 5 mg Xylocaine Marcaine mixture and debridement lesion fully and applied dressing as there was some bleeding due to scar tissue. Instructed on soaks and Lamisil usage and reappoint her recheck if symptoms persist

## 2013-10-15 ENCOUNTER — Other Ambulatory Visit: Payer: Self-pay | Admitting: Family Medicine

## 2013-12-10 ENCOUNTER — Other Ambulatory Visit: Payer: Self-pay

## 2013-12-11 ENCOUNTER — Other Ambulatory Visit: Payer: Self-pay | Admitting: Internal Medicine

## 2013-12-11 MED ORDER — DIAZEPAM 5 MG PO TABS: ORAL_TABLET | ORAL | Status: DC

## 2013-12-11 NOTE — Telephone Encounter (Signed)
Done hardcopy to robin  

## 2013-12-11 NOTE — Telephone Encounter (Signed)
Faxed hardcopy for Valium to [facility]

## 2013-12-11 NOTE — Telephone Encounter (Signed)
Faxed hardcopy for valium to [facility]

## 2013-12-11 NOTE — Telephone Encounter (Signed)
Did not received hardcopy 

## 2014-02-08 ENCOUNTER — Other Ambulatory Visit: Payer: Self-pay | Admitting: Internal Medicine

## 2014-02-09 NOTE — Telephone Encounter (Signed)
Dr Linda Hedges has retired,  Pt needs to establish with new pcp

## 2014-02-09 NOTE — Telephone Encounter (Signed)
Called the patient left a detailed message of pcp instructions on refill.

## 2014-03-12 ENCOUNTER — Encounter: Payer: Self-pay | Admitting: Internal Medicine

## 2014-03-12 ENCOUNTER — Other Ambulatory Visit: Payer: Self-pay | Admitting: Internal Medicine

## 2014-03-12 MED ORDER — DIAZEPAM 5 MG PO TABS: ORAL_TABLET | ORAL | Status: DC

## 2014-03-14 ENCOUNTER — Other Ambulatory Visit: Payer: Self-pay | Admitting: Internal Medicine

## 2014-03-15 ENCOUNTER — Encounter: Payer: Self-pay | Admitting: Internal Medicine

## 2014-03-16 NOTE — Telephone Encounter (Signed)
Called [facility] spoke wotj Juliann Pulse gave md authorization...Johny Chess

## 2014-03-19 NOTE — Telephone Encounter (Signed)
Medication already called [facility]...[name]

## 2014-04-13 ENCOUNTER — Ambulatory Visit (INDEPENDENT_AMBULATORY_CARE_PROVIDER_SITE_OTHER): Payer: Medicare Other | Admitting: Internal Medicine

## 2014-04-13 ENCOUNTER — Encounter: Payer: Self-pay | Admitting: Internal Medicine

## 2014-04-13 ENCOUNTER — Other Ambulatory Visit (INDEPENDENT_AMBULATORY_CARE_PROVIDER_SITE_OTHER): Payer: Medicare Other

## 2014-04-13 VITALS — BP 126/88 | HR 63 | Temp 97.9°F | Ht 69.0 in | Wt 195.0 lb

## 2014-04-13 DIAGNOSIS — N320 Bladder-neck obstruction: Secondary | ICD-10-CM

## 2014-04-13 DIAGNOSIS — I10 Essential (primary) hypertension: Secondary | ICD-10-CM

## 2014-04-13 DIAGNOSIS — Z0000 Encounter for general adult medical examination without abnormal findings: Secondary | ICD-10-CM

## 2014-04-13 DIAGNOSIS — E785 Hyperlipidemia, unspecified: Secondary | ICD-10-CM

## 2014-04-13 DIAGNOSIS — Z125 Encounter for screening for malignant neoplasm of prostate: Secondary | ICD-10-CM

## 2014-04-13 LAB — CBC WITH DIFFERENTIAL/PLATELET
Basophils Absolute: 0 10*3/uL (ref 0.0–0.1)
Basophils Relative: 0.4 % (ref 0.0–3.0)
EOS ABS: 0.2 10*3/uL (ref 0.0–0.7)
EOS PCT: 3.5 % (ref 0.0–5.0)
HEMATOCRIT: 46.8 % (ref 39.0–52.0)
HEMOGLOBIN: 15.5 g/dL (ref 13.0–17.0)
LYMPHS ABS: 2 10*3/uL (ref 0.7–4.0)
Lymphocytes Relative: 27.6 % (ref 12.0–46.0)
MCHC: 33.2 g/dL (ref 30.0–36.0)
MCV: 95.3 fl (ref 78.0–100.0)
MONO ABS: 0.7 10*3/uL (ref 0.1–1.0)
Monocytes Relative: 9.2 % (ref 3.0–12.0)
Neutro Abs: 4.2 10*3/uL (ref 1.4–7.7)
Neutrophils Relative %: 59.3 % (ref 43.0–77.0)
Platelets: 219 10*3/uL (ref 150.0–400.0)
RBC: 4.91 Mil/uL (ref 4.22–5.81)
RDW: 13.3 % (ref 11.5–15.5)
WBC: 7.2 10*3/uL (ref 4.0–10.5)

## 2014-04-13 LAB — URINALYSIS
BILIRUBIN URINE: NEGATIVE
KETONES UR: NEGATIVE
LEUKOCYTES UA: NEGATIVE
Nitrite: NEGATIVE
Specific Gravity, Urine: 1.02 (ref 1.000–1.030)
TOTAL PROTEIN, URINE-UPE24: NEGATIVE
Urine Glucose: NEGATIVE
Urobilinogen, UA: 0.2 (ref 0.0–1.0)
pH: 6 (ref 5.0–8.0)

## 2014-04-13 LAB — LIPID PANEL
CHOL/HDL RATIO: 3
CHOLESTEROL: 191 mg/dL (ref 0–200)
HDL: 64.2 mg/dL (ref >39.00)
LDL CALC: 115 mg/dL — AB (ref 0–99)
NonHDL: 126.8
Triglycerides: 60 mg/dL (ref 0.0–149.0)
VLDL: 12 mg/dL (ref 0.0–40.0)

## 2014-04-13 LAB — BASIC METABOLIC PANEL
BUN: 24 mg/dL — ABNORMAL HIGH (ref 6–23)
CHLORIDE: 99 meq/L (ref 96–112)
CO2: 31 mEq/L (ref 19–32)
CREATININE: 1.2 mg/dL (ref 0.4–1.5)
Calcium: 9.8 mg/dL (ref 8.4–10.5)
GFR: 61.26 mL/min (ref >60.00)
Glucose, Bld: 98 mg/dL (ref 70–99)
Potassium: 3.6 mEq/L (ref 3.5–5.1)
Sodium: 137 mEq/L (ref 135–145)

## 2014-04-13 LAB — HEPATIC FUNCTION PANEL
ALK PHOS: 57 U/L (ref 39–117)
ALT: 20 U/L (ref 0–53)
AST: 28 U/L (ref 0–37)
Albumin: 4.1 g/dL (ref 3.5–5.2)
BILIRUBIN TOTAL: 0.9 mg/dL (ref 0.2–1.2)
Bilirubin, Direct: 0.1 mg/dL (ref 0.0–0.3)
Total Protein: 7.6 g/dL (ref 6.0–8.3)

## 2014-04-13 LAB — TSH: TSH: 1.92 u[IU]/mL (ref 0.35–4.50)

## 2014-04-13 LAB — PSA: PSA: 1.33 ng/mL (ref 0.10–4.00)

## 2014-04-13 MED ORDER — HYDROCHLOROTHIAZIDE 25 MG PO TABS: 25.0000 mg | ORAL_TABLET | Freq: Every day | ORAL | Status: DC

## 2014-04-13 MED ORDER — POTASSIUM CHLORIDE CRYS ER 20 MEQ PO TBCR: EXTENDED_RELEASE_TABLET | ORAL | Status: DC

## 2014-04-13 NOTE — Progress Notes (Signed)
Subjective:    Patient ID: [name], male    DOB: 1935/06/04, 79 y.o.   MRN: [id number]  HPI  New pt - former pt of Dr Linda Hedges  The patient is here for a wellness exam. The patient has been doing well overall without major physical or psychological issues going on lately. The patient needs to address  chronic hypertension that has been well controlled with medicines; to address chronic  hyperlipidemia controlled with medicines as well   Wt Readings from Last 3 Encounters:  04/13/14 195 lb (88.451 kg)  08/31/13 190 lb (86.183 kg)  05/06/13 190 lb 3.2 oz (86.274 kg)   BP Readings from Last 3 Encounters:  04/13/14 126/88  08/31/13 115/63  05/06/13 148/92     Review of Systems  Constitutional: Negative for appetite change, fatigue and unexpected weight change.  HENT: Negative for congestion, nosebleeds, sneezing, sore throat and trouble swallowing.   Eyes: Negative for itching and visual disturbance.  Respiratory: Negative for cough.   Cardiovascular: Negative for chest pain, palpitations and leg swelling.  Gastrointestinal: Negative for nausea, diarrhea, blood in stool and abdominal distention.  Genitourinary: Negative for frequency and hematuria.  Musculoskeletal: Negative for back pain, joint swelling, gait problem and neck pain.  Skin: Negative for rash and wound.  Neurological: Negative for dizziness, tremors, speech difficulty and weakness.  Psychiatric/Behavioral: Negative for suicidal ideas, sleep disturbance, dysphoric mood and agitation. The patient is not nervous/anxious.        Objective:   Physical Exam  Constitutional: He is oriented to person, place, and time. He appears well-developed and well-nourished. No distress.  HENT:  Head: Normocephalic and atraumatic.  Right Ear: External ear normal.  Left Ear: External ear normal.  Nose: Nose normal.  Mouth/Throat: Oropharynx is clear and moist. No oropharyngeal exudate.  Eyes: Conjunctivae and EOM are  normal. Pupils are equal, round, and reactive to light. Right eye exhibits no discharge. Left eye exhibits no discharge. No scleral icterus.  Neck: Normal range of motion. Neck supple. No JVD present. No tracheal deviation present. No thyromegaly present.  Cardiovascular: Normal rate, regular rhythm, normal heart sounds and intact distal pulses.  Exam reveals no gallop and no friction rub.   No murmur heard. Pulmonary/Chest: Effort normal and breath sounds normal. No stridor. No respiratory distress. He has no wheezes. He has no rales. He exhibits no tenderness.  Abdominal: Soft. Bowel sounds are normal. He exhibits no distension and no mass. There is no tenderness. There is no rebound and no guarding.  Genitourinary: Rectum normal, prostate normal and penis normal. Guaiac negative stool. No penile tenderness.  Musculoskeletal: Normal range of motion. He exhibits no edema or tenderness.  Lymphadenopathy:    He has no cervical adenopathy.  Neurological: He is alert and oriented to person, place, and time. He has normal reflexes. No cranial nerve deficit. He exhibits normal muscle tone. Coordination normal.  Skin: Skin is warm and dry. No rash noted. He is not diaphoretic. No erythema. No pallor.  Psychiatric: He has a normal mood and affect. His behavior is normal. Judgment and thought content normal.   Lab Results  Component Value Date   WBC 5.1 02/01/2010   HGB 14.8 02/01/2010   HCT 42.5 02/01/2010   PLT 195.0 02/01/2010   GLUCOSE 97 05/06/2013   CHOL 174 05/21/2012   TRIG 130.0 05/21/2012   HDL 64.50 05/21/2012   LDLDIRECT 119.5 09/24/2006   LDLCALC 84 05/21/2012   ALT 30 05/21/2012   AST  33 05/21/2012   NA 139 05/06/2013   K 3.6 05/06/2013   CL 97 05/06/2013   CREATININE 1.1 05/06/2013   BUN 25* 05/06/2013   CO2 33* 05/06/2013   TSH 1.84 05/06/2013   PSA 1.26 02/01/2010          Assessment & Plan:

## 2014-04-13 NOTE — Progress Notes (Signed)
Pre visit review using our clinic review tool, if applicable. No additional management support is needed unless otherwise documented below in the visit note. 

## 2014-04-13 NOTE — Assessment & Plan Note (Signed)

## 2014-04-13 NOTE — Patient Instructions (Signed)

## 2014-07-05 ENCOUNTER — Other Ambulatory Visit: Payer: Self-pay | Admitting: Internal Medicine

## 2014-08-26 ENCOUNTER — Telehealth: Payer: Self-pay | Admitting: Internal Medicine

## 2014-08-26 MED ORDER — DICLOFENAC SODIUM 50 MG PO TBEC: 50.0000 mg | DELAYED_RELEASE_TABLET | Freq: Two times a day (BID) | ORAL | Status: DC

## 2014-08-26 NOTE — Telephone Encounter (Signed)
C/o R shoulder MSK Needs Voltaren

## 2014-09-06 ENCOUNTER — Ambulatory Visit: Payer: Medicare Other

## 2014-10-18 ENCOUNTER — Other Ambulatory Visit: Payer: Self-pay | Admitting: Internal Medicine

## 2015-03-02 ENCOUNTER — Other Ambulatory Visit: Payer: Self-pay | Admitting: Internal Medicine

## 2015-03-03 NOTE — Telephone Encounter (Signed)
Done

## 2015-05-03 ENCOUNTER — Other Ambulatory Visit: Payer: Self-pay | Admitting: Internal Medicine

## 2015-05-31 ENCOUNTER — Other Ambulatory Visit: Payer: Self-pay | Admitting: Internal Medicine

## 2015-06-02 NOTE — Telephone Encounter (Signed)
[facility]ed [facility]ill [facility] had to leave on pharmacy vm...[name]

## 2015-09-26 DIAGNOSIS — L720 Epidermal cyst: Secondary | ICD-10-CM | POA: Diagnosis not present

## 2015-09-26 DIAGNOSIS — L821 Other seborrheic keratosis: Secondary | ICD-10-CM | POA: Diagnosis not present

## 2015-09-26 DIAGNOSIS — Z85828 Personal history of other malignant neoplasm of skin: Secondary | ICD-10-CM | POA: Diagnosis not present

## 2015-10-03 ENCOUNTER — Other Ambulatory Visit: Payer: Self-pay | Admitting: Internal Medicine

## 2015-10-03 ENCOUNTER — Encounter: Payer: Self-pay | Admitting: Internal Medicine

## 2015-10-05 ENCOUNTER — Encounter: Payer: Self-pay | Admitting: Internal Medicine

## 2015-10-06 ENCOUNTER — Other Ambulatory Visit: Payer: Self-pay | Admitting: *Deleted

## 2015-10-06 NOTE — Telephone Encounter (Signed)
Very sorry  LOV was jan 2016, which is more than the 12 mo + 3 mo grace period usually given for eligibility for refills  Please ask pt make ROV for refills with PCP if possible

## 2015-10-06 NOTE — Telephone Encounter (Signed)
Ok to Rf diazepam in PCP's absence? Thanks!

## 2015-10-11 MED ORDER — DIAZEPAM 5 MG PO TABS: ORAL_TABLET | ORAL | Status: DC

## 2015-10-11 NOTE — Telephone Encounter (Signed)
Message             ----- Message -----     From: Cassandria Anger, MD     Sent: 10/11/2015 12:28 AM      To: Riley Kill, CMA    Subject: FW: Non-Urgent Medical Question               OK to fill this prescription with additional refills x3    Thank you!    MD ok refill on diazepam rx has been called [facility]...Johny Chess

## 2015-11-01 ENCOUNTER — Ambulatory Visit (INDEPENDENT_AMBULATORY_CARE_PROVIDER_SITE_OTHER): Payer: Medicare Other | Admitting: Internal Medicine

## 2015-11-01 ENCOUNTER — Encounter: Payer: Self-pay | Admitting: Internal Medicine

## 2015-11-01 ENCOUNTER — Other Ambulatory Visit: Payer: Self-pay | Admitting: Internal Medicine

## 2015-11-01 VITALS — BP 142/90 | HR 60 | Ht 69.0 in | Wt 204.0 lb

## 2015-11-01 DIAGNOSIS — Z0000 Encounter for general adult medical examination without abnormal findings: Secondary | ICD-10-CM

## 2015-11-01 DIAGNOSIS — I10 Essential (primary) hypertension: Secondary | ICD-10-CM

## 2015-11-01 DIAGNOSIS — N320 Bladder-neck obstruction: Secondary | ICD-10-CM

## 2015-11-01 DIAGNOSIS — R9431 Abnormal electrocardiogram [ECG] [EKG]: Secondary | ICD-10-CM | POA: Insufficient documentation

## 2015-11-01 DIAGNOSIS — K8020 Calculus of gallbladder without cholecystitis without obstruction: Secondary | ICD-10-CM

## 2015-11-01 DIAGNOSIS — R0989 Other specified symptoms and signs involving the circulatory and respiratory systems: Secondary | ICD-10-CM | POA: Diagnosis not present

## 2015-11-01 MED ORDER — POTASSIUM CHLORIDE CRYS ER 20 MEQ PO TBCR: 20.0000 meq | EXTENDED_RELEASE_TABLET | Freq: Every day | ORAL | 3 refills | Status: DC

## 2015-11-01 MED ORDER — HYDROCHLOROTHIAZIDE 25 MG PO TABS: 25.0000 mg | ORAL_TABLET | Freq: Every day | ORAL | Status: DC

## 2015-11-01 MED ORDER — VITAMIN D 1000 UNITS PO TABS: 1000.0000 [IU] | ORAL_TABLET | Freq: Every day | ORAL | 11 refills | Status: AC

## 2015-11-01 NOTE — Assessment & Plan Note (Signed)
No sx's 

## 2015-11-01 NOTE — Patient Instructions (Addendum)
Normal BP<130/85    Preventive Care for Adults, Male A healthy lifestyle and preventive care can promote health and wellness. Preventive health guidelines for men include the following key practices:  A routine yearly physical is a good way to check with your health care provider about your health and preventative screening. It is a chance to share any concerns and updates on your health and to receive a thorough exam.  Visit your dentist for a routine exam and preventative care every 6 months. Brush your teeth twice a day and floss once a day. Good oral hygiene prevents tooth decay and gum disease.  The frequency of eye exams is based on your age, health, family medical history, use of contact lenses, and other factors. Follow your health care provider's recommendations for frequency of eye exams.  Eat a healthy diet. Foods such as vegetables, fruits, whole grains, low-fat dairy products, and lean protein foods contain the nutrients you need without too many calories. Decrease your intake of foods high in solid fats, added sugars, and salt. Eat the right amount of calories for you.Get information about a proper diet from your health care provider, if necessary.  Regular physical exercise is one of the most important things you can do for your health. Most adults should get at least 150 minutes of moderate-intensity exercise (any activity that increases your heart rate and causes you to sweat) each week. In addition, most adults need muscle-strengthening exercises on 2 or more days a week.  Maintain a healthy weight. The body mass index (BMI) is a screening tool to identify possible weight problems. It provides an estimate of body fat based on height and weight. Your health care provider can find your BMI and can help you achieve or maintain a healthy weight.For adults 20 years and older:  A BMI below 18.5 is considered underweight.  A BMI of 18.5 to 24.9 is normal.  A BMI of 25 to 29.9 is  considered overweight.  A BMI of 30 and above is considered obese.  Maintain normal blood lipids and cholesterol levels by exercising and minimizing your intake of saturated fat. Eat a balanced diet with plenty of fruit and vegetables. Blood tests for lipids and cholesterol should begin at age 20 and be repeated every 5 years. If your lipid or cholesterol levels are high, you are over 50, or you are at high risk for heart disease, you may need your cholesterol levels checked more frequently.Ongoing high lipid and cholesterol levels should be treated with medicines if diet and exercise are not working.  If you smoke, find out from your health care provider how to quit. If you do not use tobacco, do not start.  Lung cancer screening is recommended for adults aged 55-80 years who are at high risk for developing lung cancer because of a history of smoking. A yearly low-dose CT scan of the lungs is recommended for people who have at least a 30-pack-year history of smoking and are a current smoker or have quit within the past 15 years. A pack year of smoking is smoking an average of 1 pack of cigarettes a day for 1 year (for example: 1 pack a day for 30 years or 2 packs a day for 15 years). Yearly screening should continue until the smoker has stopped smoking for at least 15 years. Yearly screening should be stopped for people who develop a health problem that would prevent them from having lung cancer treatment.  If you choose to drink   do not have more than 2 drinks per day. One drink is considered to be 12 ounces (355 mL) of beer, 5 ounces (148 mL) of wine, or 1.5 ounces (44 mL) of liquor.  Avoid use of street drugs. Do not share needles with anyone. Ask for help if you need support or instructions about stopping the use of drugs.  High blood pressure causes heart disease and increases the risk of stroke. Your blood pressure should be checked at least every 1-2 years. Ongoing high blood pressure  should be treated with medicines, if weight loss and exercise are not effective.  If you are 39-17 years old, ask your health care provider if you should take aspirin to prevent heart disease.  Diabetes screening is done by taking a blood sample to check your blood glucose level after you have not eaten for a certain period of time (fasting). If you are not overweight and you do not have risk factors for diabetes, you should be screened once every 3 years starting at age 10. If you are overweight or obese and you are 31-56 years of age, you should be screened for diabetes every year as part of your cardiovascular risk assessment.  Colorectal cancer can be detected and often prevented. Most routine colorectal cancer screening begins at the age of 29 and continues through age 93. However, your health care provider may recommend screening at an earlier age if you have risk factors for colon cancer. On a yearly basis, your health care provider may provide home test kits to check for hidden blood in the stool. Use of a small camera at the end of a tube to directly examine the colon (sigmoidoscopy or colonoscopy) can detect the earliest forms of colorectal cancer. Talk to your health care provider about this at age 85, when routine screening begins. Direct exam of the colon should be repeated every 5-10 years through age 23, unless early forms of precancerous polyps or small growths are found.  People who are at an increased risk for hepatitis B should be screened for this virus. You are considered at high risk for hepatitis B if:  You were born in a country where hepatitis B occurs often. Talk with your health care provider about which countries are considered high risk.  Your parents were born in a high-risk country and you have not received a shot to protect against hepatitis B (hepatitis B vaccine).  You have HIV or AIDS.  You use needles to inject street drugs.  You live with, or have sex with,  someone who has hepatitis B.  You are a man who has sex with other men (MSM).  You get hemodialysis treatment.  You take certain medicines for conditions such as cancer, organ transplantation, and autoimmune conditions.  Hepatitis C blood testing is recommended for all people born from [birth year] through 1965 and any individual with known risks for hepatitis C.  Practice safe sex. Use condoms and avoid high-risk sexual practices to reduce the spread of sexually transmitted infections (STIs). STIs include gonorrhea, chlamydia, syphilis, trichomonas, herpes, HPV, and human immunodeficiency virus (HIV). Herpes, HIV, and HPV are viral illnesses that have no cure. They can result in disability, cancer, and death.  If you are a man who has sex with other men, you should be screened at least once per year for:  HIV.  Urethral, rectal, and pharyngeal infection of gonorrhea, chlamydia, or both.  If you are at risk of being infected with HIV, it is recommended  that you take a prescription medicine daily to prevent HIV infection. This is called preexposure prophylaxis (PrEP). You are considered at risk if:  You are a man who has sex with other men (MSM) and have other risk factors.  You are a heterosexual man, are sexually active, and are at increased risk for HIV infection.  You take drugs by injection.  You are sexually active with a partner who has HIV.  Talk with your health care provider about whether you are at high risk of being infected with HIV. If you choose to begin PrEP, you should first be tested for HIV. You should then be tested every 3 months for as long as you are taking PrEP.  A one-time screening for abdominal aortic aneurysm (AAA) and surgical repair of large AAAs by ultrasound are recommended for men ages 25 to 55 years who are current or former smokers.  Healthy men should no longer receive prostate-specific antigen (PSA) blood tests as part of routine cancer screening. Talk with  your health care provider about prostate cancer screening.  Testicular cancer screening is not recommended for adult males who have no symptoms. Screening includes self-exam, a health care provider exam, and other screening tests. Consult with your health care provider about any symptoms you have or any concerns you have about testicular cancer.  Use sunscreen. Apply sunscreen liberally and repeatedly throughout the day. You should seek shade when your shadow is shorter than you. Protect yourself by wearing long sleeves, pants, a wide-brimmed hat, and sunglasses year round, whenever you are outdoors.  Once a month, do a whole-body skin exam, using a mirror to look at the skin on your back. Tell your health care provider about new moles, moles that have irregular borders, moles that are larger than a pencil eraser, or moles that have changed in shape or color.  Stay current with required vaccines (immunizations).  Influenza vaccine. All adults should be immunized every year.  Tetanus, diphtheria, and acellular pertussis (Td, Tdap) vaccine. An adult who has not previously received Tdap or who does not know his vaccine status should receive 1 dose of Tdap. This initial dose should be followed by tetanus and diphtheria toxoids (Td) booster doses every 10 years. Adults with an unknown or incomplete history of completing a 3-dose immunization series with Td-containing vaccines should begin or complete a primary immunization series including a Tdap dose. Adults should receive a Td booster every 10 years.  Varicella vaccine. An adult without evidence of immunity to varicella should receive 2 doses or a second dose if he has previously received 1 dose.  Human papillomavirus (HPV) vaccine. Males aged 11-21 years who have not received the vaccine previously should receive the 3-dose series. Males aged 22-26 years may be immunized. Immunization is recommended through the age of 24 years for any male who has sex  with males and did not get any or all doses earlier. Immunization is recommended for any person with an immunocompromised condition through the age of 29 years if he did not get any or all doses earlier. During the 3-dose series, the second dose should be obtained 4-8 weeks after the first dose. The third dose should be obtained 24 weeks after the first dose and 16 weeks after the second dose.  Zoster vaccine. One dose is recommended for adults aged 69 years or older unless certain conditions are present.  Measles, mumps, and rubella (MMR) vaccine. Adults born before 60 generally are considered immune to measles and mumps.  Adults born in [birth year] or later should have 1 or more doses of MMR vaccine unless there is a contraindication to the vaccine or there is laboratory evidence of immunity to each of the three diseases. A routine second dose of MMR vaccine should be obtained at least 28 days after the first dose for students attending postsecondary schools, health care workers, or international travelers. People who received inactivated measles vaccine or an unknown type of measles vaccine during 1963-1967 should receive 2 doses of MMR vaccine. People who received inactivated mumps vaccine or an unknown type of mumps vaccine before 1979 and are at high risk for mumps infection should consider immunization with 2 doses of MMR vaccine. Unvaccinated health care workers born before [birth year] who lack laboratory evidence of measles, mumps, or rubella immunity or laboratory confirmation of disease should consider measles and mumps immunization with 2 doses of MMR vaccine or rubella immunization with 1 dose of MMR vaccine.  Pneumococcal 13-valent conjugate (PCV13) vaccine. When indicated, a person who is uncertain of his immunization history and has no record of immunization should receive the PCV13 vaccine. All adults 57 years of age and older should receive this vaccine. An adult aged 56 years or older who has certain  medical conditions and has not been previously immunized should receive 1 dose of PCV13 vaccine. This PCV13 should be followed with a dose of pneumococcal polysaccharide (PPSV23) vaccine. Adults who are at high risk for pneumococcal disease should obtain the PPSV23 vaccine at least 8 weeks after the dose of PCV13 vaccine. Adults older than 78 years of age who have normal immune system function should obtain the PPSV23 vaccine dose at least 1 year after the dose of PCV13 vaccine.  Pneumococcal polysaccharide (PPSV23) vaccine. When PCV13 is also indicated, PCV13 should be obtained first. All adults aged 9 years and older should be immunized. An adult younger than age 28 years who has certain medical conditions should be immunized. Any person who resides in a nursing home or long-term care facility should be immunized. An adult smoker should be immunized. People with an immunocompromised condition and certain other conditions should receive both PCV13 and PPSV23 vaccines. People with human immunodeficiency virus (HIV) infection should be immunized as soon as possible after diagnosis. Immunization during chemotherapy or radiation therapy should be avoided. Routine use of PPSV23 vaccine is not recommended for American Indians, [place] Natives, or people younger than 65 years unless there are medical conditions that require PPSV23 vaccine. When indicated, people who have unknown immunization and have no record of immunization should receive PPSV23 vaccine. One-time revaccination 5 years after the first dose of PPSV23 is recommended for people aged 19-64 years who have chronic kidney failure, nephrotic syndrome, asplenia, or immunocompromised conditions. People who received 1-2 doses of PPSV23 before age 42 years should receive another dose of PPSV23 vaccine at age 68 years or later if at least 5 years have passed since the previous dose. Doses of PPSV23 are not needed for people immunized with PPSV23 at or after age 64  years.  Meningococcal vaccine. Adults with asplenia or persistent complement component deficiencies should receive 2 doses of quadrivalent meningococcal conjugate (MenACWY-D) vaccine. The doses should be obtained at least 2 months apart. Microbiologists working with certain meningococcal bacteria, [employer] recruits, people at risk during an outbreak, and people who travel to or live in countries with a high rate of meningitis should be immunized. A first-year college student up through age 52 years who is living in a residence  dose if he did not receive a dose on or after his 16th birthday. Adults who have certain high-risk conditions should receive one or more doses of vaccine.  Hepatitis A vaccine. Adults who wish to be protected from this disease, have chronic liver disease, work with hepatitis A-infected animals, work in hepatitis A research labs, or travel to or work in countries with a high rate of hepatitis A should be immunized. Adults who were previously unvaccinated and who anticipate close contact with an international adoptee during the first 60 days after arrival in the [place] from a country with a high rate of hepatitis A should be immunized.  Hepatitis B vaccine. Adults should be immunized if they wish to be protected from this disease, are under age 59 years and have diabetes, have chronic liver disease, have had more than one sex partner in the past 6 months, may be exposed to blood or other infectious body fluids, are household contacts or sex partners of hepatitis B positive people, are clients or workers in certain care facilities, or travel to or work in countries with a high rate of hepatitis B.  Haemophilus influenzae type b (Hib) vaccine. A previously unvaccinated person with asplenia or sickle cell disease or having a scheduled splenectomy should receive 1 dose of Hib vaccine. Regardless of previous immunization, a recipient of a hematopoietic stem cell  transplant should receive a 3-dose series 6-12 months after his successful transplant. Hib vaccine is not recommended for adults with HIV infection. Preventive Service / Frequency Ages 19 to 39  Blood pressure check.** / Every 3-5 years.  Lipid and cholesterol check.** / Every 5 years beginning at age 20.  Hepatitis C blood test.** / For any individual with known risks for hepatitis C.  Skin self-exam. / Monthly.  Influenza vaccine. / Every year.  Tetanus, diphtheria, and acellular pertussis (Tdap, Td) vaccine.** / Consult your health care provider. 1 dose of Td every 10 years.  Varicella vaccine.** / Consult your health care provider.  HPV vaccine. / 3 doses over 6 months, if 26 or younger.  Measles, mumps, rubella (MMR) vaccine.** / You need at least 1 dose of MMR if you were born in 1957 or later. You may also need a second dose.  Pneumococcal 13-valent conjugate (PCV13) vaccine.** / Consult your health care provider.  Pneumococcal polysaccharide (PPSV23) vaccine.** / 1 to 2 doses if you smoke cigarettes or if you have certain conditions.  Meningococcal vaccine.** / 1 dose if you are age 19 to 21 years and a first-year college student living in a residence hall, or have one of several medical conditions. You may also need additional booster doses.  Hepatitis A vaccine.** / Consult your health care provider.  Hepatitis B vaccine.** / Consult your health care provider.  Haemophilus influenzae type b (Hib) vaccine.** / Consult your health care provider. Ages 40 to 64  Blood pressure check.** / Every year.  Lipid and cholesterol check.** / Every 5 years beginning at age 20.  Lung cancer screening. / Every year if you are aged 55-80 years and have a 30-pack-year history of smoking and currently smoke or have quit within the past 15 years. Yearly screening is stopped once you have quit smoking for at least 15 years or develop a health problem that would prevent you from having  lung cancer treatment.  Fecal occult blood test (FOBT) of stool. / Every year beginning at age 50 and continuing until age 75. You may not have to do   not have to do this test if you get a colonoscopy every 10 years.  Flexible sigmoidoscopy** or colonoscopy.** / Every 5 years for a flexible sigmoidoscopy or every 10 years for a colonoscopy beginning at age 39 and continuing until age 66.  Hepatitis C blood test.** / For all people born from [birth year] through 1965 and any individual with known risks for hepatitis C.  Skin self-exam. / Monthly.  Influenza vaccine. / Every year.  Tetanus, diphtheria, and acellular pertussis (Tdap/Td) vaccine.** / Consult your health care provider. 1 dose of Td every 10 years.  Varicella vaccine.** / Consult your health care provider.  Zoster vaccine.** / 1 dose for adults aged 82 years or older.  Measles, mumps, rubella (MMR) vaccine.** / You need at least 1 dose of MMR if you were born in 1955 or later. You may also need a second dose.  Pneumococcal 13-valent conjugate (PCV13) vaccine.** / Consult your health care provider.  Pneumococcal polysaccharide (PPSV23) vaccine.** / 1 to 2 doses if you smoke cigarettes or if you have certain conditions.  Meningococcal vaccine.** / Consult your health care provider.  Hepatitis A vaccine.** / Consult your health care provider.  Hepatitis B vaccine.** / Consult your health care provider.  Haemophilus influenzae type b (Hib) vaccine.** / Consult your health care provider. Ages 66 and over  Blood pressure check.** / Every year.  Lipid and cholesterol check.**/ Every 5 years beginning at age 59.  Lung cancer screening. / Every year if you are aged 43-80 years and have a 30-pack-year history of smoking and currently smoke or have quit within the past 15 years. Yearly screening is stopped once you have quit smoking for at least 15 years or develop a health problem that would prevent you from having lung cancer treatment.  Fecal  occult blood test (FOBT) of stool. / Every year beginning at age 42 and continuing until age 28. You may not have to do this test if you get a colonoscopy every 10 years.  Flexible sigmoidoscopy** or colonoscopy.** / Every 5 years for a flexible sigmoidoscopy or every 10 years for a colonoscopy beginning at age 67 and continuing until age 51.  Hepatitis C blood test.** / For all people born from [birth year] through 1963 and any individual with known risks for hepatitis C.  Abdominal aortic aneurysm (AAA) screening.** / A one-time screening for ages 43 to 35 years who are current or former smokers.  Skin self-exam. / Monthly.  Influenza vaccine. / Every year.  Tetanus, diphtheria, and acellular pertussis (Tdap/Td) vaccine.** / 1 dose of Td every 10 years.  Varicella vaccine.** / Consult your health care provider.  Zoster vaccine.** / 1 dose for adults aged 42 years or older.  Pneumococcal 13-valent conjugate (PCV13) vaccine.** / 1 dose for all adults aged 28 years and older.  Pneumococcal polysaccharide (PPSV23) vaccine.** / 1 dose for all adults aged 70 years and older.  Meningococcal vaccine.** / Consult your health care provider.  Hepatitis A vaccine.** / Consult your health care provider.  Hepatitis B vaccine.** / Consult your health care provider.  Haemophilus influenzae type b (Hib) vaccine.** / Consult your health care provider. **Family history and personal history of risk and conditions may change your health care provider's recommendations.   This information is not intended to replace advice given to you by your health care provider. Make sure you discuss any questions you have with your health care provider.   Document Released: 05/15/2001 Document Revised: 04/09/2014 Document Reviewed: 08/14/2010 Elsevier  Interactive Patient Education [employer].

## 2015-11-01 NOTE — Progress Notes (Signed)
Pre visit review using our clinic review tool, if applicable. No additional management support is needed unless otherwise documented below in the visit note. 

## 2015-11-01 NOTE — Assessment & Plan Note (Addendum)
Here for medicare wellness/physical  Diet: heart healthy  Physical activity: not sedentary - very active Depression/mood screen: negative  Hearing: intact to whispered voice  Visual acuity: grossly normal, performs annual eye exam  ADLs: capable  Fall risk: none  Home safety: good  Cognitive evaluation: intact to orientation, naming, recall and repetition  EOL planning: adv directives, full code/ I agree  I have personally reviewed and have noted  1. The patient's medical and social history  2. Their use of alcohol, tobacco or illicit drugs  3. Their current medications and supplements  4. The patient's functional ability including ADL's, fall risks, home safety risks and hearing or visual impairment.  5. Diet and physical activities  6. Evidence for depression or mood disorders    Today patient counseled on age appropriate routine health concerns for screening and prevention, each reviewed and up to date or declined. Immunizations reviewed and up to date or declined. Labs ordered and reviewed. Risk factors for depression reviewed and negative. Hearing function and visual acuity are intact. ADLs screened and addressed as needed. Functional ability and level of safety reviewed and appropriate. Education, counseling and referrals performed based on assessed risks today. Patient provided with a copy of personalized plan for preventive services.        

## 2015-11-01 NOTE — Progress Notes (Signed)
Subjective:  Patient ID: [name], male    DOB: 22-Nov-1935  Age: 80 y.o. MRN: [id number]  CC: No chief complaint on file.   HPI [name] presents for a well   Outpatient Medications Prior to Visit  Medication Sig Dispense Refill  . aspirin 81 MG tablet Take 81 mg by mouth daily.      . diazepam (VALIUM) 5 MG tablet TAKE 1 TABLET AT BEDTIME AS NEEDED. 30 tablet 3  . diphenhydrAMINE (BENADRYL) 25 MG tablet 2 by mouth at bedtime     . hydrochlorothiazide (HYDRODIURIL) 25 MG tablet TAKE 1 TABLET DAILY. 100 tablet 1  . Multiple Vitamin (MULTIVITAMIN) capsule Take 1 capsule by mouth daily.      . potassium chloride SA (K-DUR,KLOR-CON) 20 MEQ tablet TAKE 1 TABLET DAILY. 30 tablet 5  . diclofenac (VOLTAREN) 50 MG EC tablet Take 1 tablet (50 mg total) by mouth 2 (two) times daily after a meal. (Patient not taking: Reported on 11/01/2015) 60 tablet 0   No facility-administered medications prior to visit.     ROS Review of Systems  Constitutional: Negative for appetite change, fatigue and unexpected weight change.  HENT: Negative for congestion, nosebleeds, sneezing, sore throat and trouble swallowing.   Eyes: Negative for itching and visual disturbance.  Respiratory: Negative for cough.   Cardiovascular: Negative for chest pain, palpitations and leg swelling.  Gastrointestinal: Negative for abdominal distention, blood in stool, diarrhea and nausea.  Genitourinary: Negative for frequency and hematuria.  Musculoskeletal: Negative for back pain, gait problem, joint swelling and neck pain.  Skin: Negative for rash.  Neurological: Negative for dizziness, tremors, speech difficulty and weakness.  Psychiatric/Behavioral: Positive for behavioral problems. Negative for agitation, dysphoric mood, sleep disturbance and suicidal ideas. The patient is not nervous/anxious.     Objective:  BP (!) 142/90   Pulse 60   Ht 5\' 9"  (1.753 m)   Wt 204 lb (92.5 kg)   SpO2 97%   BMI 30.13 kg/m    BP Readings from Last 3 Encounters:  11/01/15 (!) 142/90  04/13/14 126/88  08/31/13 115/63    Wt Readings from Last 3 Encounters:  11/01/15 204 lb (92.5 kg)  04/13/14 195 lb (88.5 kg)  08/31/13 190 lb (86.2 kg)    Physical Exam  Constitutional: He is oriented to person, place, and time. He appears well-developed. No distress.  NAD  HENT:  Mouth/Throat: Oropharynx is clear and moist.  Eyes: Conjunctivae are normal. Pupils are equal, round, and reactive to light.  Neck: Normal range of motion. No JVD present. No thyromegaly present.  Cardiovascular: Normal rate, regular rhythm, normal heart sounds and intact distal pulses.  Exam reveals no gallop and no friction rub.   No murmur heard. Pulmonary/Chest: Effort normal and breath sounds normal. No respiratory distress. He has no wheezes. He has no rales. He exhibits no tenderness.  Abdominal: Soft. Bowel sounds are normal. He exhibits no distension and no mass. There is no tenderness. There is no rebound and no guarding.  Musculoskeletal: Normal range of motion. He exhibits no edema or tenderness.  Lymphadenopathy:    He has no cervical adenopathy.  Neurological: He is alert and oriented to person, place, and time. He has normal reflexes. No cranial nerve deficit. He exhibits normal muscle tone. He displays a negative Romberg sign. Coordination and gait normal.  Skin: Skin is warm and dry. No rash noted.  Psychiatric: He has a normal mood and affect. His behavior is normal. Judgment  and thought content normal.  mild B bruit  Procedure: EKG Indication: shoulder  pain Impression: NSR. Poor R wave progression. No acute changes.   Lab Results  Component Value Date   WBC 7.2 04/13/2014   HGB 15.5 04/13/2014   HCT 46.8 04/13/2014   PLT 219.0 04/13/2014   GLUCOSE 98 04/13/2014   CHOL 191 04/13/2014   TRIG 60.0 04/13/2014   HDL 64.20 04/13/2014   LDLDIRECT 119.5 09/24/2006   LDLCALC 115 (H) 04/13/2014   ALT 20 04/13/2014   AST  28 04/13/2014   NA 137 04/13/2014   K 3.6 04/13/2014   CL 99 04/13/2014   CREATININE 1.2 04/13/2014   BUN 24 (H) 04/13/2014   CO2 31 04/13/2014   TSH 1.92 04/13/2014   PSA 1.33 04/13/2014    Ct Chest Wo/cm Screening  Result Date: 06/04/2013 CLINICAL DATA:  80 year old male with prior history of smoking. Lung cancer screening examination. EXAM: CT CHEST SCREENING WITHOUT CONTRAST TECHNIQUE: Multidetector CT imaging of the chest was performed following the standard low-dose protocol without IV contrast. COMPARISON:  No priors. FINDINGS: Mediastinum: Heart size is normal. There is no significant pericardial fluid, thickening or pericardial calcification. No pathologically enlarged mediastinal or hilar lymph nodes. Please note that accurate exclusion of hilar adenopathy is limited on noncontrast CT scans. Esophagus is unremarkable in appearance. Lungs/Pleura: 3 mm subpleural nodule in the periphery of the right lower lobe (image 222 of series 5). 3 mm calcified subpleural nodule in the periphery of the left upper lobe is benign (image 155 of series 5). No other larger more suspicious appearing pulmonary nodules or masses are otherwise noted. No acute consolidative airspace disease. No pleural effusions. Upper Abdomen: Unremarkable. Musculoskeletal: There are no aggressive appearing lytic or blastic lesions noted in the visualized portions of the skeleton. Orthopedic fixation hardware in the lower cervical spine incompletely imaged. IMPRESSION: 1. Lung-RADS Category 2, benign appearance or behavior. Continue annual screening with low-dose chest CT without contrast in 12 months. 2. Incidental findings, as above. These results will be called to the ordering clinician or representative by the Radiologist Assistant, and communication documented in the PACS Dashboard. Electronically Signed   By: Vinnie Langton M.D.   On: 06/04/2013 15:02    Assessment & Plan:   There are no diagnoses linked to this  encounter. I am having [name] maintain his aspirin, diphenhydrAMINE, multivitamin, diclofenac, potassium chloride SA, hydrochlorothiazide, and diazepam.  No orders of the defined types were placed in this encounter.    Follow-up: No Follow-up on file.  Walker Kehr, MD

## 2015-11-01 NOTE — Assessment & Plan Note (Signed)
Mild Car doppler US ASA

## 2015-11-01 NOTE — Assessment & Plan Note (Addendum)
HCTZ EKG

## 2015-11-01 NOTE — Assessment & Plan Note (Signed)
Will get an ECHO

## 2015-11-04 ENCOUNTER — Other Ambulatory Visit (INDEPENDENT_AMBULATORY_CARE_PROVIDER_SITE_OTHER): Payer: Medicare Other

## 2015-11-04 ENCOUNTER — Encounter: Payer: Self-pay | Admitting: Internal Medicine

## 2015-11-04 DIAGNOSIS — Z0000 Encounter for general adult medical examination without abnormal findings: Secondary | ICD-10-CM | POA: Diagnosis not present

## 2015-11-04 DIAGNOSIS — R0989 Other specified symptoms and signs involving the circulatory and respiratory systems: Secondary | ICD-10-CM

## 2015-11-04 DIAGNOSIS — N320 Bladder-neck obstruction: Secondary | ICD-10-CM | POA: Diagnosis not present

## 2015-11-04 DIAGNOSIS — I10 Essential (primary) hypertension: Secondary | ICD-10-CM

## 2015-11-04 LAB — CBC WITH DIFFERENTIAL/PLATELET
BASOS ABS: 0 10*3/uL (ref 0.0–0.1)
Basophils Relative: 0.4 % (ref 0.0–3.0)
EOS PCT: 4.5 % (ref 0.0–5.0)
Eosinophils Absolute: 0.3 10*3/uL (ref 0.0–0.7)
HEMATOCRIT: 43.3 % (ref 39.0–52.0)
HEMOGLOBIN: 14.8 g/dL (ref 13.0–17.0)
LYMPHS ABS: 2.1 10*3/uL (ref 0.7–4.0)
LYMPHS PCT: 35.3 % (ref 12.0–46.0)
MCHC: 34 g/dL (ref 30.0–36.0)
MCV: 93.1 fl (ref 78.0–100.0)
MONOS PCT: 8.7 % (ref 3.0–12.0)
Monocytes Absolute: 0.5 10*3/uL (ref 0.1–1.0)
NEUTROS PCT: 51.1 % (ref 43.0–77.0)
Neutro Abs: 3 10*3/uL (ref 1.4–7.7)
Platelets: 206 10*3/uL (ref 150.0–400.0)
RBC: 4.66 Mil/uL (ref 4.22–5.81)
RDW: 13.6 % (ref 11.5–15.5)
WBC: 5.8 10*3/uL (ref 4.0–10.5)

## 2015-11-04 LAB — BASIC METABOLIC PANEL
BUN: 25 mg/dL — ABNORMAL HIGH (ref 6–23)
CO2: 34 meq/L — AB (ref 19–32)
Calcium: 9.6 mg/dL (ref 8.4–10.5)
Chloride: 101 mEq/L (ref 96–112)
Creatinine, Ser: 1.26 mg/dL (ref 0.40–1.50)
GFR: 58.78 mL/min — AB (ref >60.00)
GLUCOSE: 97 mg/dL (ref 70–99)
POTASSIUM: 3.5 meq/L (ref 3.5–5.1)
Sodium: 142 mEq/L (ref 135–145)

## 2015-11-04 LAB — URINALYSIS
BILIRUBIN URINE: NEGATIVE
Ketones, ur: NEGATIVE
LEUKOCYTES UA: NEGATIVE
NITRITE: NEGATIVE
Specific Gravity, Urine: 1.02 (ref 1.000–1.030)
TOTAL PROTEIN, URINE-UPE24: NEGATIVE
UROBILINOGEN UA: 0.2 (ref 0.0–1.0)
Urine Glucose: NEGATIVE
pH: 6 (ref 5.0–8.0)

## 2015-11-04 LAB — HEPATIC FUNCTION PANEL
ALBUMIN: 3.9 g/dL (ref 3.5–5.2)
ALT: 19 U/L (ref 0–53)
AST: 22 U/L (ref 0–37)
Alkaline Phosphatase: 47 U/L (ref 39–117)
BILIRUBIN TOTAL: 0.8 mg/dL (ref 0.2–1.2)
Bilirubin, Direct: 0.1 mg/dL (ref 0.0–0.3)
Total Protein: 6.7 g/dL (ref 6.0–8.3)

## 2015-11-04 LAB — LIPID PANEL
CHOL/HDL RATIO: 3
Cholesterol: 185 mg/dL (ref 0–200)
HDL: 62.3 mg/dL (ref >39.00)
LDL CALC: 103 mg/dL — AB (ref 0–99)
NONHDL: 122.75
Triglycerides: 97 mg/dL (ref 0.0–149.0)
VLDL: 19.4 mg/dL (ref 0.0–40.0)

## 2015-11-04 LAB — TSH: TSH: 2.49 u[IU]/mL (ref 0.35–4.50)

## 2015-11-04 LAB — PSA: PSA: 1.51 ng/mL (ref 0.10–4.00)

## 2015-11-17 NOTE — Addendum Note (Signed)
Addended by: Cresenciano Lick on: 11/17/2015 10:10 AM   Modules accepted: Orders

## 2015-11-18 ENCOUNTER — Other Ambulatory Visit: Payer: Self-pay

## 2015-11-18 ENCOUNTER — Ambulatory Visit (HOSPITAL_COMMUNITY): Payer: Medicare Other | Attending: Cardiology

## 2015-11-18 DIAGNOSIS — I119 Hypertensive heart disease without heart failure: Secondary | ICD-10-CM | POA: Insufficient documentation

## 2015-11-18 DIAGNOSIS — I10 Essential (primary) hypertension: Secondary | ICD-10-CM | POA: Diagnosis not present

## 2015-11-18 DIAGNOSIS — R9431 Abnormal electrocardiogram [ECG] [EKG]: Secondary | ICD-10-CM | POA: Diagnosis not present

## 2015-11-18 DIAGNOSIS — Z87891 Personal history of nicotine dependence: Secondary | ICD-10-CM | POA: Diagnosis not present

## 2015-11-22 ENCOUNTER — Ambulatory Visit (INDEPENDENT_AMBULATORY_CARE_PROVIDER_SITE_OTHER): Payer: Medicare Other | Admitting: Nurse Practitioner

## 2015-11-22 ENCOUNTER — Other Ambulatory Visit (INDEPENDENT_AMBULATORY_CARE_PROVIDER_SITE_OTHER): Payer: Medicare Other

## 2015-11-22 ENCOUNTER — Telehealth: Payer: Self-pay | Admitting: Internal Medicine

## 2015-11-22 ENCOUNTER — Encounter: Payer: Self-pay | Admitting: Nurse Practitioner

## 2015-11-22 VITALS — BP 116/70 | HR 84 | Temp 98.2°F | Ht 69.0 in | Wt 206.0 lb

## 2015-11-22 DIAGNOSIS — L270 Generalized skin eruption due to drugs and medicaments taken internally: Secondary | ICD-10-CM

## 2015-11-22 DIAGNOSIS — J029 Acute pharyngitis, unspecified: Secondary | ICD-10-CM

## 2015-11-22 DIAGNOSIS — L511 Stevens-Johnson syndrome: Secondary | ICD-10-CM

## 2015-11-22 LAB — CBC WITH DIFFERENTIAL/PLATELET
BASOS ABS: 0 10*3/uL (ref 0.0–0.1)
Basophils Relative: 0.5 % (ref 0.0–3.0)
Eosinophils Absolute: 0.1 10*3/uL (ref 0.0–0.7)
Eosinophils Relative: 1.1 % (ref 0.0–5.0)
HEMATOCRIT: 43.3 % (ref 39.0–52.0)
Hemoglobin: 14.9 g/dL (ref 13.0–17.0)
LYMPHS PCT: 13.5 % (ref 12.0–46.0)
Lymphs Abs: 1.1 10*3/uL (ref 0.7–4.0)
MCHC: 34.3 g/dL (ref 30.0–36.0)
MCV: 93.2 fl (ref 78.0–100.0)
MONOS PCT: 9.6 % (ref 3.0–12.0)
Monocytes Absolute: 0.8 10*3/uL (ref 0.1–1.0)
NEUTROS ABS: 6.3 10*3/uL (ref 1.4–7.7)
Neutrophils Relative %: 75.3 % (ref 43.0–77.0)
Platelets: 180 10*3/uL (ref 150.0–400.0)
RBC: 4.65 Mil/uL (ref 4.22–5.81)
RDW: 13.3 % (ref 11.5–15.5)
WBC: 8.4 10*3/uL (ref 4.0–10.5)

## 2015-11-22 LAB — COMPREHENSIVE METABOLIC PANEL
ALBUMIN: 4.1 g/dL (ref 3.5–5.2)
ALK PHOS: 55 U/L (ref 39–117)
ALT: 44 U/L (ref 0–53)
AST: 43 U/L — ABNORMAL HIGH (ref 0–37)
BILIRUBIN TOTAL: 0.7 mg/dL (ref 0.2–1.2)
BUN: 25 mg/dL — ABNORMAL HIGH (ref 6–23)
CALCIUM: 9.4 mg/dL (ref 8.4–10.5)
CO2: 34 mEq/L — ABNORMAL HIGH (ref 19–32)
Chloride: 96 mEq/L (ref 96–112)
Creatinine, Ser: 1.53 mg/dL — ABNORMAL HIGH (ref 0.40–1.50)
GFR: 46.98 mL/min — AB (ref >60.00)
Glucose, Bld: 94 mg/dL (ref 70–99)
Potassium: 3.7 mEq/L (ref 3.5–5.1)
Sodium: 138 mEq/L (ref 135–145)
TOTAL PROTEIN: 7.6 g/dL (ref 6.0–8.3)

## 2015-11-22 LAB — POCT RAPID STREP A (OFFICE): RAPID STREP A SCREEN: NEGATIVE

## 2015-11-22 MED ORDER — METHYLPREDNISOLONE ACETATE 80 MG/ML IJ SUSP: 80.0000 mg | Freq: Once | INTRAMUSCULAR | Status: DC

## 2015-11-22 MED ORDER — PREDNISONE 10 MG (21) PO TBPK: 10.0000 mg | ORAL_TABLET | Freq: Every day | ORAL | 0 refills | Status: DC

## 2015-11-22 MED ORDER — METHYLPREDNISOLONE ACETATE 80 MG/ML IJ SUSP
80.0000 mg | Freq: Once | INTRAMUSCULAR | Status: AC
  Administered 2015-11-22: 80 mg via INTRAMUSCULAR

## 2015-11-22 NOTE — Progress Notes (Signed)
Subjective:  Patient ID: [name], male    DOB: 08-23-1935  Age: 80 y.o. MRN: [id number]  CC: Allergic Reaction (started with fever friday night, started amoxicillin (unsure of dosage). Developed swelling, burning and welts in both feet on saturday)   HPI [name] presents with rash on both feet post use of amoxicillin on Friday. On Friday he developed sore throat, fever, and malaise which prompted him to take old prescripotion of amoxicillin every 4hrs. He took total of 8tabs of amoxcillin on Friday. On Saturday, he woke up with blistering rash on both lower extremities. Rash is associated with burning, pain, and itching. Unable to wear shoes due to discomfort. Has used benadryl with some relief, has stopped used on amoxicillin. Still has sore throat but no fever. Denies any CP or SOB or dysuria or dysphagia or GI symptoms  Outpatient Medications Prior to Visit  Medication Sig Dispense Refill  . aspirin 81 MG tablet Take 81 mg by mouth daily.      . cholecalciferol (VITAMIN D) 1000 units tablet Take 1 tablet (1,000 Units total) by mouth daily. 30 tablet 11  . diazepam (VALIUM) 5 MG tablet TAKE 1 TABLET AT BEDTIME AS NEEDED. 30 tablet 3  . diphenhydrAMINE (BENADRYL) 25 MG tablet Take 1 tablet by mouth 3 (three) times daily as needed. 2 by mouth at bedtime     . hydrochlorothiazide (HYDRODIURIL) 25 MG tablet TAKE 1 TABLET DAILY. 100 tablet 0  . Multiple Vitamin (MULTIVITAMIN) capsule Take 1 capsule by mouth daily.      . potassium chloride SA (K-DUR,KLOR-CON) 20 MEQ tablet Take 1 tablet (20 mEq total) by mouth daily. 100 tablet 3   No facility-administered medications prior to visit.     ROS Review of Systems See HPI.  Objective:  BP 116/70 (BP Location: Left Arm, Patient Position: Sitting, Cuff Size: Normal)   Pulse 84   Temp 98.2 F (36.8 C) (Oral)   Ht 5\' 9"  (1.753 m)   Wt 206 lb (93.4 kg)   SpO2 95%   BMI 30.42 kg/m   BP Readings from Last 3 Encounters:    11/22/15 116/70  11/01/15 (!) 142/90  04/13/14 126/88    Wt Readings from Last 3 Encounters:  11/22/15 206 lb (93.4 kg)  11/01/15 204 lb (92.5 kg)  04/13/14 195 lb (88.5 kg)    Physical Exam  Constitutional: He is oriented to person, place, and time. He appears well-developed. No distress.  HENT:  Nose: Nose normal.  Mouth/Throat: Uvula is midline. Oropharyngeal exudate and posterior oropharyngeal erythema present.  Eyes: Conjunctivae and EOM are normal. Right eye exhibits no discharge. Left eye exhibits no discharge. No scleral icterus.  Neck: Normal range of motion. Neck supple.  Cardiovascular: Normal rate.   Pulmonary/Chest: Effort normal and breath sounds normal. No stridor.  Abdominal: Soft. Bowel sounds are normal. He exhibits no distension. There is no tenderness.  Musculoskeletal: He exhibits edema.  Lymphadenopathy:    He has no cervical adenopathy.  Neurological: He is alert and oriented to person, place, and time.  Skin: Rash noted. Rash is vesicular. There is erythema.     Erythematous vesicular rash on bilateral LE, lower back and chin.  Vitals reviewed.   Lab Results  Component Value Date   WBC 5.8 11/04/2015   HGB 14.8 11/04/2015   HCT 43.3 11/04/2015   PLT 206.0 11/04/2015   GLUCOSE 97 11/04/2015   CHOL 185 11/04/2015   TRIG 97.0 11/04/2015   HDL  62.30 11/04/2015   LDLDIRECT 119.5 09/24/2006   LDLCALC 103 (H) 11/04/2015   ALT 19 11/04/2015   AST 22 11/04/2015   NA 142 11/04/2015   K 3.5 11/04/2015   CL 101 11/04/2015   CREATININE 1.26 11/04/2015   BUN 25 (H) 11/04/2015   CO2 34 (H) 11/04/2015   TSH 2.49 11/04/2015   PSA 1.51 11/04/2015   Collaborated with Dr. Alain Marion  Assessment & Plan:  Symptoms collaborate with Katherina Right Syndrome- moderate severity.  Renardo was seen today for allergic reaction.  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 -     predniSONE (STERAPRED UNI-PAK 21 TAB) 10 MG (21) TBPK tablet; Take 1 tablet (10 mg total) by mouth daily. Start tomorrow, take with food -     CBC w/Diff; Future -     Discontinue: methylPREDNISolone acetate (DEPO-MEDROL) injection 80 mg; Inject 1 mL (80 mg total) into the muscle once.  Acute pharyngitis, unspecified etiology -     POCT rapid strep A -     COMPLETE METABOLIC PANEL WITH GFR; Future -     methylPREDNISolone acetate (DEPO-MEDROL) injection 80 mg; Inject 1 mL (80 mg total) into the muscle once. -     predniSONE (STERAPRED UNI-PAK 21 TAB) 10 MG (21) TBPK tablet; Take 1 tablet (10 mg total) by mouth daily. Start tomorrow, take with food -     CBC w/Diff; Future   I am having Mr. Gershon Crane start on predniSONE. I am also having him maintain his aspirin, diphenhydrAMINE, multivitamin, diazepam, potassium chloride SA, cholecalciferol, and hydrochlorothiazide. We administered methylPREDNISolone acetate.  Meds ordered this encounter  Medications  . methylPREDNISolone acetate (DEPO-MEDROL) injection 80 mg  . predniSONE (STERAPRED UNI-PAK 21 TAB) 10 MG (21) TBPK tablet    Sig: Take 1 tablet (10 mg total) by mouth daily. Start tomorrow, take with food    Dispense:  21 tablet    Refill:  0    Order Specific Question:   Supervising Provider    Answer:   Cassandria Anger [[id number]]  . DISCONTD: methylPREDNISolone acetate (DEPO-MEDROL) injection 80 mg     Follow-up: Return in about 3 days (around 11/25/2015) for allergic rash.  Wilfred Lacy, NP

## 2015-11-22 NOTE — Telephone Encounter (Signed)
Prescription clarification addressed with pharmacist

## 2015-11-22 NOTE — Progress Notes (Signed)
Discussed with patient in office

## 2015-11-22 NOTE — Telephone Encounter (Signed)
[facility] store call questing about direction of Rx we sent in today. Please call them back.

## 2015-11-22 NOTE — Patient Instructions (Addendum)
Strep test is negative. Do not take any antibiotics Push oral fluids Use benadryl for itching as directed on package. Go to hospital if develops any respiratory distress.

## 2015-11-22 NOTE — Progress Notes (Signed)
Pre visit review using our clinic review tool, if applicable. No additional management support is needed unless otherwise documented below in the visit note. 

## 2015-11-22 NOTE — Telephone Encounter (Signed)
I have called [facility] and explained what [name] is requesting per [name]'s instructions

## 2015-11-22 NOTE — Telephone Encounter (Signed)
I am assuming you are referring to prednisone 10mg  dose pack. What exactly is the question?

## 2015-11-23 DIAGNOSIS — R21 Rash and other nonspecific skin eruption: Secondary | ICD-10-CM | POA: Diagnosis not present

## 2015-11-23 DIAGNOSIS — Z85828 Personal history of other malignant neoplasm of skin: Secondary | ICD-10-CM | POA: Diagnosis not present

## 2015-11-23 DIAGNOSIS — L511 Stevens-Johnson syndrome: Secondary | ICD-10-CM | POA: Diagnosis not present

## 2015-11-24 DIAGNOSIS — L511 Stevens-Johnson syndrome: Secondary | ICD-10-CM | POA: Diagnosis not present

## 2015-11-25 ENCOUNTER — Encounter: Payer: Self-pay | Admitting: Nurse Practitioner

## 2015-11-25 ENCOUNTER — Ambulatory Visit (INDEPENDENT_AMBULATORY_CARE_PROVIDER_SITE_OTHER): Payer: Medicare Other | Admitting: Nurse Practitioner

## 2015-11-25 ENCOUNTER — Ambulatory Visit (HOSPITAL_COMMUNITY)
Admission: RE | Admit: 2015-11-25 | Discharge: 2015-11-25 | Disposition: A | Discharge status: Home / Self Care | Payer: Medicare Other | Source: Ambulatory Visit | Attending: Cardiology | Admitting: Cardiology

## 2015-11-25 VITALS — BP 120/70 | HR 74 | Temp 98.0°F | Ht 69.0 in | Wt 205.2 lb

## 2015-11-25 DIAGNOSIS — Z720 Tobacco use: Secondary | ICD-10-CM | POA: Insufficient documentation

## 2015-11-25 DIAGNOSIS — R0989 Other specified symptoms and signs involving the circulatory and respiratory systems: Secondary | ICD-10-CM | POA: Diagnosis not present

## 2015-11-25 DIAGNOSIS — L511 Stevens-Johnson syndrome: Secondary | ICD-10-CM | POA: Insufficient documentation

## 2015-11-25 DIAGNOSIS — I10 Essential (primary) hypertension: Secondary | ICD-10-CM | POA: Insufficient documentation

## 2015-11-25 NOTE — Patient Instructions (Addendum)
May soak feet in cool water and epsom salt twice a day as needed. Keep skin clean and dry at all times. Wear shoes at all times to prevent skin infection Return to office if any signs of cellulitis.  Start vaseline guaze dressing when blisters open on soles of feet.  May use tylenol or norco for pain.

## 2015-11-25 NOTE — Progress Notes (Signed)
Pre visit review using our clinic review tool, if applicable. No additional management support is needed unless otherwise documented below in the visit note. 

## 2015-11-25 NOTE — Progress Notes (Signed)
Subjective:  Patient ID: [name], male    DOB: [birth year]  Age: 80 y.o. MRN: [id number]  CC: Follow-up (Pt states that he feels like the prednisone is working but he feels like the blisters are popping. Prednisone dosing was increased by the dermatologist. )   Rash  This is a new problem. The current episode started in the past 7 days. The problem has been gradually improving since onset. The rash is diffuse (worse on feet). The rash is characterized by blistering, pain, scaling and peeling. He was exposed to a new medication. Associated symptoms include a sore throat. Pertinent negatives include no anorexia, facial edema, fatigue, fever, joint pain, shortness of breath or vomiting. Past treatments include oral steroids and analgesics (current used of oral prednisone, dose increased by dermatologist). The treatment provided moderate relief.  till unable to wear regular shoes.  [name] presents for rash  Outpatient Medications Prior to Visit  Medication Sig Dispense Refill  . aspirin 81 MG tablet Take 81 mg by mouth daily.      . cholecalciferol (VITAMIN D) 1000 units tablet Take 1 tablet (1,000 Units total) by mouth daily. 30 tablet 11  . diazepam (VALIUM) 5 MG tablet TAKE 1 TABLET AT BEDTIME AS NEEDED. 30 tablet 3  . diphenhydrAMINE (BENADRYL) 25 MG tablet Take 1 tablet by mouth 3 (three) times daily as needed. 2 by mouth at bedtime     . hydrochlorothiazide (HYDRODIURIL) 25 MG tablet TAKE 1 TABLET DAILY. 100 tablet 0  . Multiple Vitamin (MULTIVITAMIN) capsule Take 1 capsule by mouth daily.      . potassium chloride SA (K-DUR,KLOR-CON) 20 MEQ tablet Take 1 tablet (20 mEq total) by mouth daily. 100 tablet 3  . predniSONE (STERAPRED UNI-PAK 21 TAB) 10 MG (21) TBPK tablet Take 1 tablet (10 mg total) by mouth daily. Start tomorrow, take with food 21 tablet 0   No facility-administered medications prior to visit.     ROS Review of Systems  Constitutional: Negative for  fatigue and fever.  HENT: Positive for sore throat.   Respiratory: Negative for shortness of breath.   Gastrointestinal: Negative for anorexia and vomiting.  Musculoskeletal: Negative for joint pain.  Skin: Positive for rash.    Objective:  BP 120/70 (BP Location: Left Arm, Patient Position: Sitting, Cuff Size: Normal)   Pulse 74   Temp 98 F (36.7 C) (Oral)   Ht 5\' 9"  (1.753 m)   Wt 205 lb 4 oz (93.1 kg)   SpO2 98%   BMI 30.31 kg/m   BP Readings from Last 3 Encounters:  11/25/15 120/70  11/22/15 116/70  11/01/15 (!) 142/90    Wt Readings from Last 3 Encounters:  11/25/15 205 lb 4 oz (93.1 kg)  11/22/15 206 lb (93.4 kg)  11/01/15 204 lb (92.5 kg)    Physical Exam  Constitutional: He is oriented to person, place, and time.  HENT:  Right Ear: External ear normal.  Left Ear: External ear normal.  Nose: No mucosal edema.  Mouth/Throat: Uvula is midline. Posterior oropharyngeal erythema present. No oropharyngeal exudate or posterior oropharyngeal edema.  Eyes: Conjunctivae and EOM are normal. Pupils are equal, round, and reactive to light.  Neck: Normal range of motion. Neck supple.  Cardiovascular: Normal rate.   Pulmonary/Chest: Effort normal.  Musculoskeletal: He exhibits edema.  Lymphadenopathy:    He has no cervical adenopathy.  Neurological: He is alert and oriented to person, place, and time.  Skin: Skin is dry. Lesion and  rash noted. Rash is macular and vesicular.     Vesicular lesions on palms and feet are improving, less erythematous. Till tender to touch. No drainage, no induration.    Lab Results  Component Value Date   WBC 8.4 11/22/2015   HGB 14.9 11/22/2015   HCT 43.3 11/22/2015   PLT 180.0 11/22/2015   GLUCOSE 94 11/22/2015   CHOL 185 11/04/2015   TRIG 97.0 11/04/2015   HDL 62.30 11/04/2015   LDLDIRECT 119.5 09/24/2006   LDLCALC 103 (H) 11/04/2015   ALT 44 11/22/2015   AST 43 (H) 11/22/2015   NA 138 11/22/2015   K 3.7 11/22/2015   CL 96  11/22/2015   CREATININE 1.53 (H) 11/22/2015   BUN 25 (H) 11/22/2015   CO2 34 (H) 11/22/2015   TSH 2.49 11/04/2015   PSA 1.51 11/04/2015    No results found.  Assessment & Plan:   [name] was seen today for follow-up.  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  Meds ordered this encounter  Medications  . predniSONE (DELTASONE) 10 MG tablet    Refill:  0  . predniSONE (DELTASONE) 20 MG tablet    Refill:  0  . HYDROcodone-acetaminophen (NORCO/VICODIN) 5-325 MG tablet    Refill:  0     Follow-up: Return in about 1 week (around 12/02/2015) for re eval of rash.  Wilfred Lacy, NP

## 2015-11-29 DIAGNOSIS — L511 Stevens-Johnson syndrome: Secondary | ICD-10-CM | POA: Diagnosis not present

## 2015-11-29 DIAGNOSIS — M2041 Other hammer toe(s) (acquired), right foot: Secondary | ICD-10-CM | POA: Diagnosis not present

## 2015-12-02 ENCOUNTER — Encounter: Payer: Self-pay | Admitting: Nurse Practitioner

## 2015-12-02 ENCOUNTER — Ambulatory Visit (INDEPENDENT_AMBULATORY_CARE_PROVIDER_SITE_OTHER): Payer: Medicare Other | Admitting: Nurse Practitioner

## 2015-12-02 VITALS — BP 120/80 | HR 60 | Temp 97.7°F | Ht 69.0 in | Wt 205.0 lb

## 2015-12-02 DIAGNOSIS — L511 Stevens-Johnson syndrome: Secondary | ICD-10-CM | POA: Diagnosis not present

## 2015-12-02 NOTE — Progress Notes (Signed)
Subjective:  Patient ID: [name], male    DOB: March 26, 1936  Age: 80 y.o. MRN: [id number]  CC: Follow-up ([name]'s Johnsons Syndrom. Patient had a question about where or not he could start going to the pool for exercise. )   [name] presents for Evaluation of rash.  HPI He has completed prednisone regimen. Reports complete resolution of swelling and tenderness. Denies any fever, pain with ambulation, drainage. Denies any side effects of prednisone..  Outpatient Medications Prior to Visit  Medication Sig Dispense Refill  . aspirin 81 MG tablet Take 81 mg by mouth daily.      . cholecalciferol (VITAMIN D) 1000 units tablet Take 1 tablet (1,000 Units total) by mouth daily. 30 tablet 11  . diazepam (VALIUM) 5 MG tablet TAKE 1 TABLET AT BEDTIME AS NEEDED. 30 tablet 3  . diphenhydrAMINE (BENADRYL) 25 MG tablet Take 1 tablet by mouth 3 (three) times daily as needed. 2 by mouth at bedtime     . hydrochlorothiazide (HYDRODIURIL) 25 MG tablet TAKE 1 TABLET DAILY. 100 tablet 0  . Multiple Vitamin (MULTIVITAMIN) capsule Take 1 capsule by mouth daily.      . potassium chloride SA (K-DUR,KLOR-CON) 20 MEQ tablet Take 1 tablet (20 mEq total) by mouth daily. 100 tablet 3  . predniSONE (DELTASONE) 20 MG tablet   0  . HYDROcodone-acetaminophen (NORCO/VICODIN) 5-325 MG tablet   0  . predniSONE (DELTASONE) 10 MG tablet   0  . predniSONE (STERAPRED UNI-PAK 21 TAB) 10 MG (21) TBPK tablet Take 1 tablet (10 mg total) by mouth daily. Start tomorrow, take with food 21 tablet 0   No facility-administered medications prior to visit.     ROS See HPI  Objective:  BP 120/80 (BP Location: Left Arm, Patient Position: Sitting, Cuff Size: Normal)   Pulse 60   Temp 97.7 F (36.5 C) (Oral)   Ht 5\' 9"  (1.753 m)   Wt 205 lb (93 kg)   SpO2 97%   BMI 30.27 kg/m   BP Readings from Last 3 Encounters:  12/02/15 120/80  11/25/15 120/70  11/22/15 116/70    Wt Readings from Last 3 Encounters:    12/02/15 205 lb (93 kg)  11/25/15 205 lb 4 oz (93.1 kg)  11/22/15 206 lb (93.4 kg)    Physical Exam  Constitutional: He appears well-developed. No distress.  Skin: Skin is dry. No erythema.     Exfoliating skin on bilateral feet. No erythema, no induration, no drainage. No skin or joint or bony tenderness.( No rash on hands or face.  Vitals reviewed.   Lab Results  Component Value Date   WBC 8.4 11/22/2015   HGB 14.9 11/22/2015   HCT 43.3 11/22/2015   PLT 180.0 11/22/2015   GLUCOSE 94 11/22/2015   CHOL 185 11/04/2015   TRIG 97.0 11/04/2015   HDL 62.30 11/04/2015   LDLDIRECT 119.5 09/24/2006   LDLCALC 103 (H) 11/04/2015   ALT 44 11/22/2015   AST 43 (H) 11/22/2015   NA 138 11/22/2015   K 3.7 11/22/2015   CL 96 11/22/2015   CREATININE 1.53 (H) 11/22/2015   BUN 25 (H) 11/22/2015   CO2 34 (H) 11/22/2015   TSH 2.49 11/04/2015   PSA 1.51 11/04/2015    No results found.  Assessment & Plan:   [name] was seen today for follow-up.  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  Perry's HYDROcodone-acetaminophen. I am also having him maintain his aspirin, diphenhydrAMINE, multivitamin, diazepam, potassium chloride SA, cholecalciferol, hydrochlorothiazide, and predniSONE.  No orders of the defined types were placed in this encounter.   Follow-up: Return if symptoms worsen or fail to improve.  Wilfred Lacy, NP

## 2015-12-02 NOTE — Patient Instructions (Addendum)
Rash has significantly improved. No sign of infection today. Patient advised to wait another week before starting water aerobics. Patient advised to use moisturizing cream as needed. Return to office develops any signs of infection.

## 2015-12-02 NOTE — Progress Notes (Signed)
Pre visit review using our clinic review tool, if applicable. No additional management support is needed unless otherwise documented below in the visit note. 

## 2015-12-02 NOTE — Assessment & Plan Note (Signed)
Rash has significantly improved. No sign of infection today. Patient advised to wait another week before starting water aerobics. Patient advised to moisturizing cream as needed. he is to return to office develops any signs of infection.

## 2015-12-04 ENCOUNTER — Other Ambulatory Visit: Payer: Self-pay | Admitting: Internal Medicine

## 2015-12-27 ENCOUNTER — Ambulatory Visit (INDEPENDENT_AMBULATORY_CARE_PROVIDER_SITE_OTHER)
Admission: RE | Admit: 2015-12-27 | Discharge: 2015-12-27 | Disposition: A | Discharge status: Home / Self Care | Payer: Medicare Other | Source: Ambulatory Visit | Attending: Internal Medicine | Admitting: Internal Medicine

## 2015-12-27 ENCOUNTER — Ambulatory Visit (INDEPENDENT_AMBULATORY_CARE_PROVIDER_SITE_OTHER): Payer: Medicare Other | Admitting: Internal Medicine

## 2015-12-27 ENCOUNTER — Encounter: Payer: Self-pay | Admitting: Internal Medicine

## 2015-12-27 ENCOUNTER — Other Ambulatory Visit (INDEPENDENT_AMBULATORY_CARE_PROVIDER_SITE_OTHER): Payer: Medicare Other

## 2015-12-27 ENCOUNTER — Inpatient Hospital Stay (HOSPITAL_COMMUNITY)
Admission: EM | Admit: 2015-12-27 | Discharge: 2015-12-29 | DRG: 872 | Disposition: A | Discharge status: Home / Self Care | Payer: Medicare Other | Attending: Internal Medicine | Admitting: Internal Medicine

## 2015-12-27 ENCOUNTER — Encounter (HOSPITAL_COMMUNITY): Payer: Self-pay | Admitting: Emergency Medicine

## 2015-12-27 DIAGNOSIS — Z981 Arthrodesis status: Secondary | ICD-10-CM | POA: Diagnosis not present

## 2015-12-27 DIAGNOSIS — Z9889 Other specified postprocedural states: Secondary | ICD-10-CM

## 2015-12-27 DIAGNOSIS — E876 Hypokalemia: Secondary | ICD-10-CM | POA: Diagnosis present

## 2015-12-27 DIAGNOSIS — E860 Dehydration: Secondary | ICD-10-CM | POA: Diagnosis not present

## 2015-12-27 DIAGNOSIS — R6889 Other general symptoms and signs: Secondary | ICD-10-CM

## 2015-12-27 DIAGNOSIS — N179 Acute kidney failure, unspecified: Secondary | ICD-10-CM | POA: Diagnosis not present

## 2015-12-27 DIAGNOSIS — N183 Chronic kidney disease, stage 3 (moderate): Secondary | ICD-10-CM | POA: Diagnosis present

## 2015-12-27 DIAGNOSIS — Z86010 Personal history of colonic polyps: Secondary | ICD-10-CM

## 2015-12-27 DIAGNOSIS — Z87891 Personal history of nicotine dependence: Secondary | ICD-10-CM | POA: Diagnosis not present

## 2015-12-27 DIAGNOSIS — Z79899 Other long term (current) drug therapy: Secondary | ICD-10-CM | POA: Diagnosis not present

## 2015-12-27 DIAGNOSIS — A419 Sepsis, unspecified organism: Secondary | ICD-10-CM | POA: Diagnosis not present

## 2015-12-27 DIAGNOSIS — Z880 Allergy status to penicillin: Secondary | ICD-10-CM

## 2015-12-27 DIAGNOSIS — R509 Fever, unspecified: Secondary | ICD-10-CM

## 2015-12-27 DIAGNOSIS — N289 Disorder of kidney and ureter, unspecified: Secondary | ICD-10-CM

## 2015-12-27 DIAGNOSIS — N390 Urinary tract infection, site not specified: Secondary | ICD-10-CM

## 2015-12-27 DIAGNOSIS — N281 Cyst of kidney, acquired: Secondary | ICD-10-CM | POA: Diagnosis not present

## 2015-12-27 DIAGNOSIS — B9689 Other specified bacterial agents as the cause of diseases classified elsewhere: Secondary | ICD-10-CM | POA: Diagnosis present

## 2015-12-27 DIAGNOSIS — I10 Essential (primary) hypertension: Secondary | ICD-10-CM | POA: Diagnosis present

## 2015-12-27 DIAGNOSIS — K8020 Calculus of gallbladder without cholecystitis without obstruction: Secondary | ICD-10-CM | POA: Diagnosis not present

## 2015-12-27 DIAGNOSIS — I129 Hypertensive chronic kidney disease with stage 1 through stage 4 chronic kidney disease, or unspecified chronic kidney disease: Secondary | ICD-10-CM | POA: Diagnosis present

## 2015-12-27 DIAGNOSIS — Z7982 Long term (current) use of aspirin: Secondary | ICD-10-CM

## 2015-12-27 LAB — CBC WITH DIFFERENTIAL/PLATELET
BASOS PCT: 0.1 % (ref 0.0–3.0)
Basophils Absolute: 0 10*3/uL (ref 0.0–0.1)
Basophils Absolute: 0 10*3/uL (ref 0.0–0.1)
Basophils Relative: 0 %
EOS ABS: 0 10*3/uL (ref 0.0–0.7)
EOS PCT: 0.3 % (ref 0.0–5.0)
Eosinophils Absolute: 0 10*3/uL (ref 0.0–0.7)
Eosinophils Relative: 0 %
HCT: 38.3 % — ABNORMAL LOW (ref 39.0–52.0)
HEMATOCRIT: 44.1 % (ref 39.0–52.0)
HEMOGLOBIN: 15 g/dL (ref 13.0–17.0)
Hemoglobin: 13 g/dL (ref 13.0–17.0)
Lymphocytes Relative: 4 %
Lymphs Abs: 0.8 10*3/uL (ref 0.7–4.0)
Lymphs Abs: 0.4 10*3/uL — ABNORMAL LOW (ref 0.7–4.0)
MCH: 31.6 pg (ref 26.0–34.0)
MCHC: 33.9 g/dL (ref 30.0–36.0)
MCHC: 33.9 g/dL (ref 30.0–36.0)
MCV: 93.1 fl (ref 78.0–100.0)
MCV: 93.2 fL (ref 78.0–100.0)
MONOS PCT: 1.7 % — AB (ref 3.0–12.0)
Monocytes Absolute: 0.2 10*3/uL (ref 0.1–1.0)
Monocytes Absolute: 0.4 10*3/uL (ref 0.1–1.0)
Monocytes Relative: 4 %
NEUTROS ABS: 11.1 10*3/uL — AB (ref 1.4–7.7)
Neutro Abs: 10.2 10*3/uL — ABNORMAL HIGH (ref 1.7–7.7)
Neutrophils Relative %: 91.1 % — ABNORMAL HIGH (ref 43.0–77.0)
Neutrophils Relative %: 92 %
Platelets: 200 10*3/uL (ref 150.0–400.0)
Platelets: 154 10*3/uL (ref 150–400)
RBC: 4.74 Mil/uL (ref 4.22–5.81)
RBC: 4.11 MIL/uL — ABNORMAL LOW (ref 4.22–5.81)
RDW: 13.9 % (ref 11.5–15.5)
RDW: 13.9 % (ref 11.5–15.5)
WBC: 12.1 10*3/uL — AB (ref 4.0–10.5)
WBC: 11.1 10*3/uL — ABNORMAL HIGH (ref 4.0–10.5)

## 2015-12-27 LAB — URINALYSIS, ROUTINE W REFLEX MICROSCOPIC
BILIRUBIN URINE: NEGATIVE
BILIRUBIN URINE: NEGATIVE
Glucose, UA: NEGATIVE mg/dL
Hgb urine dipstick: NEGATIVE
KETONES UR: NEGATIVE
KETONES UR: NEGATIVE mg/dL
NITRITE: NEGATIVE
NITRITE: NEGATIVE
PH: 6 (ref 5.0–8.0)
PH: 6 (ref 5.0–8.0)
PROTEIN: NEGATIVE mg/dL
Specific Gravity, Urine: 1.01 (ref 1.000–1.030)
Specific Gravity, Urine: 1.011 (ref 1.005–1.030)
TOTAL PROTEIN, URINE-UPE24: NEGATIVE
Urine Glucose: NEGATIVE
Urobilinogen, UA: 0.2 (ref 0.0–1.0)

## 2015-12-27 LAB — COMPREHENSIVE METABOLIC PANEL
ALT: 19 U/L (ref 17–63)
AST: 26 U/L (ref 15–41)
Albumin: 3.4 g/dL — ABNORMAL LOW (ref 3.5–5.0)
Alkaline Phosphatase: 41 U/L (ref 38–126)
Anion gap: 8 (ref 5–15)
BUN: 25 mg/dL — ABNORMAL HIGH (ref 6–20)
CO2: 29 mmol/L (ref 22–32)
Calcium: 8.9 mg/dL (ref 8.9–10.3)
Chloride: 100 mmol/L — ABNORMAL LOW (ref 101–111)
Creatinine, Ser: 1.55 mg/dL — ABNORMAL HIGH (ref 0.61–1.24)
GFR calc Af Amer: 48 mL/min — ABNORMAL LOW (ref >60)
GFR calc non Af Amer: 41 mL/min — ABNORMAL LOW (ref >60)
Glucose, Bld: 90 mg/dL (ref 65–99)
Potassium: 3 mmol/L — ABNORMAL LOW (ref 3.5–5.1)
Sodium: 137 mmol/L (ref 135–145)
Total Bilirubin: 1.4 mg/dL — ABNORMAL HIGH (ref 0.3–1.2)
Total Protein: 6.5 g/dL (ref 6.5–8.1)

## 2015-12-27 LAB — BASIC METABOLIC PANEL
BUN: 23 mg/dL (ref 6–23)
CHLORIDE: 97 meq/L (ref 96–112)
CO2: 33 meq/L — AB (ref 19–32)
Calcium: 9.7 mg/dL (ref 8.4–10.5)
Creatinine, Ser: 1.55 mg/dL — ABNORMAL HIGH (ref 0.40–1.50)
GFR: 46.27 mL/min — ABNORMAL LOW (ref >60.00)
Glucose, Bld: 84 mg/dL (ref 70–99)
POTASSIUM: 3.5 meq/L (ref 3.5–5.1)
SODIUM: 137 meq/L (ref 135–145)

## 2015-12-27 LAB — URINE MICROSCOPIC-ADD ON
RBC / HPF: NONE SEEN RBC/hpf (ref 0–5)
Squamous Epithelial / LPF: NONE SEEN

## 2015-12-27 LAB — HEPATIC FUNCTION PANEL
ALBUMIN: 4 g/dL (ref 3.5–5.2)
ALT: 23 U/L (ref 0–53)
AST: 27 U/L (ref 0–37)
Alkaline Phosphatase: 51 U/L (ref 39–117)
Bilirubin, Direct: 0.1 mg/dL (ref 0.0–0.3)
Total Bilirubin: 1.3 mg/dL — ABNORMAL HIGH (ref 0.2–1.2)
Total Protein: 7.5 g/dL (ref 6.0–8.3)

## 2015-12-27 LAB — SEDIMENTATION RATE: Sed Rate: 31 mm/hr — ABNORMAL HIGH (ref 0–20)

## 2015-12-27 LAB — MAGNESIUM: Magnesium: 2.1 mg/dL (ref 1.7–2.4)

## 2015-12-27 LAB — I-STAT CG4 LACTIC ACID, ED: Lactic Acid, Venous: 0.85 mmol/L (ref 0.5–1.9)

## 2015-12-27 MED ORDER — ONDANSETRON HCL 4 MG PO TABS: 4.0000 mg | ORAL_TABLET | Freq: Four times a day (QID) | ORAL | Status: DC | PRN

## 2015-12-27 MED ORDER — ENOXAPARIN SODIUM 40 MG/0.4ML ~~LOC~~ SOLN
40.0000 mg | SUBCUTANEOUS | Status: DC
  Administered 2015-12-27 – 2015-12-28 (×2): 40 mg via SUBCUTANEOUS
  Filled 2015-12-27 (×2): qty 0.4

## 2015-12-27 MED ORDER — ASPIRIN 81 MG PO CHEW
81.0000 mg | CHEWABLE_TABLET | Freq: Every day | ORAL | Status: DC
  Administered 2015-12-28 – 2015-12-29 (×2): 81 mg via ORAL
  Filled 2015-12-27 (×3): qty 1

## 2015-12-27 MED ORDER — ADULT MULTIVITAMIN W/MINERALS CH
2.0000 | ORAL_TABLET | Freq: Every day | ORAL | Status: DC
  Administered 2015-12-28 – 2015-12-29 (×2): 2 via ORAL
  Filled 2015-12-27 (×2): qty 2

## 2015-12-27 MED ORDER — ACETAMINOPHEN 325 MG PO TABS
650.0000 mg | ORAL_TABLET | Freq: Four times a day (QID) | ORAL | Status: DC | PRN
  Administered 2015-12-27 – 2015-12-28 (×2): 650 mg via ORAL
  Filled 2015-12-27 (×2): qty 2

## 2015-12-27 MED ORDER — CIPROFLOXACIN IN D5W 400 MG/200ML IV SOLN: 400.0000 mg | Freq: Two times a day (BID) | INTRAVENOUS | Status: DC

## 2015-12-27 MED ORDER — VITAMIN C 500 MG PO TABS
500.0000 mg | ORAL_TABLET | Freq: Every day | ORAL | Status: DC
  Administered 2015-12-28 – 2015-12-29 (×2): 500 mg via ORAL
  Filled 2015-12-27 (×2): qty 1

## 2015-12-27 MED ORDER — SODIUM CHLORIDE 0.9 % IV BOLUS (SEPSIS)
1000.0000 mL | Freq: Once | INTRAVENOUS | Status: AC
  Administered 2015-12-27: 1000 mL via INTRAVENOUS

## 2015-12-27 MED ORDER — AZITHROMYCIN 250 MG PO TABS: ORAL_TABLET | ORAL | 0 refills | Status: DC

## 2015-12-27 MED ORDER — DIPHENHYDRAMINE HCL 25 MG PO CAPS
50.0000 mg | ORAL_CAPSULE | Freq: Every evening | ORAL | Status: DC | PRN
  Filled 2015-12-27: qty 2

## 2015-12-27 MED ORDER — SODIUM CHLORIDE 0.9 % IV SOLN
INTRAVENOUS | Status: DC
  Administered 2015-12-27: 18:00:00 via INTRAVENOUS

## 2015-12-27 MED ORDER — MULTIVITAMINS PO CAPS: 2.0000 | ORAL_CAPSULE | Freq: Every day | ORAL | Status: DC

## 2015-12-27 MED ORDER — ACETAMINOPHEN 650 MG RE SUPP: 650.0000 mg | Freq: Four times a day (QID) | RECTAL | Status: DC | PRN

## 2015-12-27 MED ORDER — ONDANSETRON HCL 4 MG/2ML IJ SOLN: 4.0000 mg | Freq: Four times a day (QID) | INTRAMUSCULAR | Status: DC | PRN

## 2015-12-27 MED ORDER — POTASSIUM CHLORIDE CRYS ER 20 MEQ PO TBCR
20.0000 meq | EXTENDED_RELEASE_TABLET | Freq: Every day | ORAL | Status: DC
  Administered 2015-12-28 – 2015-12-29 (×2): 20 meq via ORAL
  Filled 2015-12-27 (×2): qty 1

## 2015-12-27 MED ORDER — POTASSIUM CHLORIDE CRYS ER 20 MEQ PO TBCR
40.0000 meq | EXTENDED_RELEASE_TABLET | Freq: Once | ORAL | Status: AC
  Administered 2015-12-27: 40 meq via ORAL
  Filled 2015-12-27: qty 2

## 2015-12-27 MED ORDER — CIPROFLOXACIN IN D5W 400 MG/200ML IV SOLN
400.0000 mg | Freq: Two times a day (BID) | INTRAVENOUS | Status: DC
  Administered 2015-12-27 – 2015-12-29 (×5): 400 mg via INTRAVENOUS
  Filled 2015-12-27 (×5): qty 200

## 2015-12-27 MED ORDER — OMEGA-3 1000 MG PO CAPS: 1.0000 | ORAL_CAPSULE | Freq: Every day | ORAL | Status: DC

## 2015-12-27 MED ORDER — OMEGA-3-ACID ETHYL ESTERS 1 G PO CAPS
1.0000 g | ORAL_CAPSULE | Freq: Every day | ORAL | Status: DC
  Administered 2015-12-28 – 2015-12-29 (×2): 1 g via ORAL
  Filled 2015-12-27 (×2): qty 1

## 2015-12-27 NOTE — Assessment & Plan Note (Signed)
Recurrent over years ?etiology Labs, CXR  12 noon: Lab called - the pt was shaking uncontrollably. [name] was seen: intense body tremor. Son Liliane Channel will take him to ER to r/o sepsis. Labs STAT

## 2015-12-27 NOTE — ED Notes (Addendum)
Patient c/o intermittent shaking since yesterday. More so today. Patient has a low grade temp and c/o body aches. patient was sent over by PCP for c/o shaking.

## 2015-12-27 NOTE — Progress Notes (Signed)
Pharmacy Antibiotic Note  [name] is a 80 y.o. male admitted on 12/27/2015 with sepsis.  Pharmacy has been consulted for Cipro dosing.  Stevens-Johnson syndrome noted with penicillin use.  Plan:  Cipro 400mg  IV q12h  No further dose adjustments expected, pharmacy will sign-off  Height: 5\' 9"  (175.3 cm) Weight: 202 lb (91.6 kg) IBW/kg (Calculated) : 70.7  Temp (24hrs), Avg:100.3 F (37.9 C), Min:99.7 F (37.6 C), Max:101.6 F (38.7 C)   Recent Labs Lab 12/27/15 1150 12/27/15 1349 12/27/15 1358  WBC 12.1* 11.1*  --   CREATININE 1.55* 1.55*  --   LATICACIDVEN  --   --  0.85    Estimated Creatinine Clearance: 43.9 mL/min (by C-G formula based on SCr of 1.55 mg/dL (H)).    Allergies  Allergen Reactions  . Amoxicillin Swelling and Rash    Has patient had a PCN reaction causing immediate rash, facial/tongue/throat swelling, SOB or lightheadedness with hypotension: No Has patient had a PCN reaction causing severe rash involving mucus membranes or skin necrosis: No Has patient had a PCN reaction that required hospitalization No Has patient had a PCN reaction occurring within the last 10 years: No If all of the above answers are "NO", then may proceed with Cephalosporin use.    Antimicrobials this admission: 9/26 >> Cipro >>  Microbiology results: 9/26 BCx: sent 9/26 UCx: sent  Thank you for allowing pharmacy to be a part of this patient's care.  Peggyann Juba, PharmD, BCPS Pager: [phone] 12/27/2015 5:56 PM

## 2015-12-27 NOTE — ED Provider Notes (Signed)
[facility] DEPT Provider Note   CSN: [id number] Arrival date & time: 12/27/15  1217     History   Chief Complaint Chief Complaint  Patient presents with  . Shaking    HPI [name] is a 80 y.o. male.  HPI Patient presents to the emergency department with rigors and body aches over the last 24 hours.  The patient states that he was seen by his primary care doctor today and sent to the emergency department for rigors.  Patient, states she has had similar symptoms previously when he had a urinary tract infection.  Patient states that he has not had any other symptomsThe patient denies chest pain, shortness of breath, headache,blurred vision, neck pain, cough, weakness, numbness, dizziness, anorexia, edema, abdominal pain, nausea, vomiting, diarrhea, rash, back pain, dysuria, hematemesis, bloody stool, near syncope, or syncope.  Patient did not take any medications prior to arrival Past Medical History:  Diagnosis Date  . CHOLELITHIASIS 12/17/2007  . COLONIC POLYPS, ADENOMATOUS, HX OF 12/17/2007  . DEGENERATIVE DISC DISEASE, CERVICAL SPINE 02/19/2008  . [place] DISEASE, LUMBAR 12/17/2007  . HYPERTENSION 12/17/2007  . INTERNAL HEMORRHOIDS 12/17/2007  . Overweight(278.02) 02/19/2008  . POLIOMYELITIS, HX OF 12/17/2007  . RENAL CYST 12/17/2007  . SHINGLES, HX OF 12/17/2007    Patient Active Problem List   Diagnosis Date Noted  . Febrile illness 12/27/2015  . Stevens-Johnson syndrome involving mucosae and <10% body surface area ([place]) 11/25/2015  . Bilateral carotid bruits 11/01/2015  . Nonspecific abnormal electrocardiogram (ECG) (EKG) 11/01/2015  . Well adult exam 04/13/2014  . Overweight(278.02) 02/19/2008  . DEGENERATIVE DISC DISEASE, CERVICAL SPINE 02/19/2008  . Essential hypertension 12/17/2007  . INTERNAL HEMORRHOIDS 12/17/2007  . Calculus of gallbladder 12/17/2007  . RENAL CYST 12/17/2007  . [place] DISEASE, LUMBAR 12/17/2007  . POLIOMYELITIS, HX OF 12/17/2007  . COLONIC  POLYPS, ADENOMATOUS, HX OF 12/17/2007  . SHINGLES, HX OF 12/17/2007    Past Surgical History:  Procedure Laterality Date  . Bilateral knee surgery     arthroscopy bilateral - Dr. Noemi Chapel  . CERVICAL FUSION    . HEMORRHOID SURGERY    . lumbar back surgery     two procedures: diskectomy and then a fusion L4-5, L5-S1 Dr. Sherwood Gambler  . TONSILLECTOMY         Home Medications    Prior to Admission medications   Medication Sig Start Date End Date Taking? Authorizing Provider  aspirin 81 MG tablet Take 81 mg by mouth daily.     Yes Historical Provider, MD  diazepam (VALIUM) 5 MG tablet TAKE 1 TABLET AT BEDTIME AS NEEDED. 10/11/15  Yes Evie Lacks Plotnikov, MD  diphenhydrAMINE (BENADRYL) 25 MG tablet Take 1 tablet by mouth 3 (three) times daily as needed. 2 by mouth at bedtime    Yes Historical Provider, MD  hydrochlorothiazide (HYDRODIURIL) 25 MG tablet TAKE 1 TABLET DAILY. 11/01/15  Yes Cassandria Anger, MD  Multiple Vitamin (MULTIVITAMIN) capsule Take 2 capsules by mouth daily.    Yes Historical Provider, MD  Omega-3 1000 MG CAPS Take 1 capsule by mouth daily.   Yes Historical Provider, MD  potassium chloride SA (K-DUR,KLOR-CON) 20 MEQ tablet Take 1 tablet (20 mEq total) by mouth daily. 11/01/15  Yes Evie Lacks Plotnikov, MD  vitamin C (ASCORBIC ACID) 500 MG tablet Take 500 mg by mouth daily.   Yes Historical Provider, MD  azithromycin (ZITHROMAX) 250 MG tablet As directed Patient not taking: Reported on 12/27/2015 12/27/15   Cassandria Anger, MD  cholecalciferol (VITAMIN D) 1000 units tablet Take 1 tablet (1,000 Units total) by mouth daily. Patient not taking: Reported on 12/27/2015 11/01/15 10/31/16  Cassandria Anger, MD    Family History Family History  Problem Relation Age of Onset  . Cancer Neg Hx   . Diabetes Neg Hx   . Heart disease Neg Hx   . Hyperlipidemia Neg Hx     Social History Social History  Substance Use Topics  . Smoking status: Former Smoker    Quit date: 05/03/2005   . Smokeless tobacco: Never Used  . Alcohol use Yes     Comment: rare drinker     Allergies   Amoxicillin   Review of Systems Review of Systems All other systems negative except as documented in the HPI. All pertinent positives and negatives as reviewed in the HPI.  Physical Exam Updated Vital Signs BP 104/58   Pulse 82   Temp 101.6 F (38.7 C) (Rectal)   Resp 18   SpO2 94%   Physical Exam  Constitutional: He is oriented to person, place, and time. He appears well-developed and well-nourished. No distress.  HENT:  Head: Normocephalic and atraumatic.  Mouth/Throat: Oropharynx is clear and moist.  Eyes: Pupils are equal, round, and reactive to light.  Neck: Normal range of motion. Neck supple.  Cardiovascular: Normal rate, regular rhythm and normal heart sounds.  Exam reveals no gallop and no friction rub.   No murmur heard. Pulmonary/Chest: Effort normal and breath sounds normal. No respiratory distress. He has no wheezes.  Abdominal: Soft. Bowel sounds are normal. He exhibits no distension. There is no tenderness.  Neurological: He is alert and oriented to person, place, and time. He exhibits normal muscle tone. Coordination normal.  Skin: Skin is warm and dry. No rash noted. No erythema.  Psychiatric: He has a normal mood and affect. His behavior is normal.  Nursing note and vitals reviewed.    ED Treatments / Results  Labs (all labs ordered are listed, but only abnormal results are displayed) Labs Reviewed  COMPREHENSIVE METABOLIC PANEL - Abnormal; Notable for the following:       Result Value   Potassium 3.0 (*)    Chloride 100 (*)    BUN 25 (*)    Creatinine, Ser 1.55 (*)    Albumin 3.4 (*)    Total Bilirubin 1.4 (*)    GFR calc non Af Amer 41 (*)    GFR calc Af Amer 48 (*)    All other components within normal limits  CBC WITH DIFFERENTIAL/PLATELET - Abnormal; Notable for the following:    WBC 11.1 (*)    RBC 4.11 (*)    HCT 38.3 (*)    Neutro Abs  10.2 (*)    Lymphs Abs 0.4 (*)    All other components within normal limits  CULTURE, BLOOD (ROUTINE X 2)  CULTURE, BLOOD (ROUTINE X 2)  URINE CULTURE  I-STAT CG4 LACTIC ACID, ED    EKG  EKG Interpretation None       Radiology Dg Chest 2 View  Result Date: 12/27/2015 CLINICAL DATA:  Fever, chills. EXAM: CHEST  2 VIEW COMPARISON:  Radiographs of March 07, 2009. FINDINGS: The heart size and mediastinal contours are within normal limits. Both lungs are clear. No pneumothorax or pleural effusion is noted. Multilevel degenerative disc disease is noted in the thoracic spine. IMPRESSION: No active cardiopulmonary disease. Electronically Signed   By: Marijo Conception, M.D.   On: 12/27/2015 13:37  Procedures Procedures (including critical care time)  Medications Ordered in ED Medications  ciprofloxacin (CIPRO) IVPB 400 mg (not administered)  sodium chloride 0.9 % bolus 1,000 mL (1,000 mLs Intravenous New Bag/Given 12/27/15 1422)     Initial Impression / Assessment and Plan / ED Course  I have reviewed the triage vital signs and the nursing notes.  Pertinent labs & imaging results that were available during my care of the patient were reviewed by me and considered in my medical decision making (see chart for details).  Clinical Course    Patient will be admitted to the hospital for worsening renal function, along with UTI and hypertension.  Patient is also had fever and rigors.  Patient is given IV fluids along with antibiotics  Final Clinical Impressions(s) / ED Diagnoses   Final diagnoses:  Rigors  UTI (lower urinary tract infection)  Renal insufficiency    New Prescriptions New Prescriptions   No medications on file     Dalia Heading, PA-C 12/27/15 [place], MD 12/28/15 [phone]

## 2015-12-27 NOTE — Progress Notes (Signed)
Pre visit review using our clinic review tool, if applicable. No additional management support is needed unless otherwise documented below in the visit note. 

## 2015-12-27 NOTE — Progress Notes (Signed)
Subjective:  Patient ID: [name], male    DOB: February 12, 1936  Age: 80 y.o. MRN: [id number]  CC: Shaking (Uncontrollable 2 episodes, 1 yesterday 1 this morning each lasting 30 min-1 hr) and Generalized Body Aches   HPI [name] presents for chills starting yesterday x 1 h then has resolved. He felt fine this am and at the office started to have hard chill this am. H/o UTI w/shaking chills 20 years ago. Occ chils every 6-12 months - self-limiting.  Outpatient Medications Prior to Visit  Medication Sig Dispense Refill  . aspirin 81 MG tablet Take 81 mg by mouth daily.      . cholecalciferol (VITAMIN D) 1000 units tablet Take 1 tablet (1,000 Units total) by mouth daily. 30 tablet 11  . diazepam (VALIUM) 5 MG tablet TAKE 1 TABLET AT BEDTIME AS NEEDED. 30 tablet 3  . diphenhydrAMINE (BENADRYL) 25 MG tablet Take 1 tablet by mouth 3 (three) times daily as needed. 2 by mouth at bedtime     . hydrochlorothiazide (HYDRODIURIL) 25 MG tablet TAKE 1 TABLET DAILY. 100 tablet 0  . Multiple Vitamin (MULTIVITAMIN) capsule Take 1 capsule by mouth daily.      . potassium chloride SA (K-DUR,KLOR-CON) 20 MEQ tablet Take 1 tablet (20 mEq total) by mouth daily. 100 tablet 3  . predniSONE (DELTASONE) 20 MG tablet   0   No facility-administered medications prior to visit.     ROS Review of Systems  Constitutional: Positive for chills, diaphoresis, fatigue and fever. Negative for appetite change and unexpected weight change.  HENT: Negative for congestion, nosebleeds, sneezing, sore throat and trouble swallowing.   Eyes: Negative for itching and visual disturbance.  Respiratory: Negative for cough.   Cardiovascular: Negative for chest pain, palpitations and leg swelling.  Gastrointestinal: Positive for nausea. Negative for abdominal distention, blood in stool and diarrhea.  Genitourinary: Negative for frequency and hematuria.  Musculoskeletal: Negative for back pain, gait problem, joint swelling  and neck pain.  Skin: Negative for rash.  Neurological: Negative for dizziness, tremors, speech difficulty and weakness.  Psychiatric/Behavioral: Negative for agitation, dysphoric mood and sleep disturbance. The patient is not nervous/anxious.     Objective:  BP 118/70   Pulse 76   Temp 99.7 F (37.6 C) (Oral)   Wt 208 lb (94.3 kg)   SpO2 96%   BMI 30.72 kg/m   BP Readings from Last 3 Encounters:  12/27/15 118/70  12/02/15 120/80  11/25/15 120/70    Wt Readings from Last 3 Encounters:  12/27/15 208 lb (94.3 kg)  12/02/15 205 lb (93 kg)  11/25/15 205 lb 4 oz (93.1 kg)    Physical Exam  Constitutional: He is oriented to person, place, and time. He appears well-developed. No distress.  NAD  HENT:  Mouth/Throat: Oropharynx is clear and moist.  Eyes: Conjunctivae are normal. Pupils are equal, round, and reactive to light.  Neck: Normal range of motion. No JVD present. No thyromegaly present.  Cardiovascular: Normal rate, regular rhythm, normal heart sounds and intact distal pulses.  Exam reveals no gallop and no friction rub.   No murmur heard. Pulmonary/Chest: Effort normal and breath sounds normal. No respiratory distress. He has no wheezes. He has no rales. He exhibits no tenderness.  Abdominal: Soft. Bowel sounds are normal. He exhibits no distension and no mass. There is no tenderness. There is no rebound and no guarding.  Musculoskeletal: Normal range of motion. He exhibits no edema or tenderness.  Lymphadenopathy:  He has no cervical adenopathy.  Neurological: He is alert and oriented to person, place, and time. He has normal reflexes. No cranial nerve deficit. He exhibits normal muscle tone. He displays a negative Romberg sign. Coordination and gait normal.  Skin: Skin is warm and dry. No rash noted.  Psychiatric: He has a normal mood and affect. His behavior is normal. Judgment and thought content normal.   12 noon: Lab called - the pt was shaking uncontrollably.  [name] was seen: intense body tremor. Son [name] will take him to ER to r/o sepsis.   Lab Results  Component Value Date   WBC 8.4 11/22/2015   HGB 14.9 11/22/2015   HCT 43.3 11/22/2015   PLT 180.0 11/22/2015   GLUCOSE 94 11/22/2015   CHOL 185 11/04/2015   TRIG 97.0 11/04/2015   HDL 62.30 11/04/2015   LDLDIRECT 119.5 09/24/2006   LDLCALC 103 (H) 11/04/2015   ALT 44 11/22/2015   AST 43 (H) 11/22/2015   NA 138 11/22/2015   K 3.7 11/22/2015   CL 96 11/22/2015   CREATININE 1.53 (H) 11/22/2015   BUN 25 (H) 11/22/2015   CO2 34 (H) 11/22/2015   TSH 2.49 11/04/2015   PSA 1.51 11/04/2015    No results found.  Assessment & Plan:   There are no diagnoses linked to this encounter. I have discontinued [name]'s predniSONE. I am also having him maintain his aspirin, diphenhydrAMINE, multivitamin, diazepam, potassium chloride SA, cholecalciferol, and hydrochlorothiazide.  No orders of the defined types were placed in this encounter.    Follow-up: No Follow-up on file.  Walker Kehr, MD

## 2015-12-27 NOTE — ED Provider Notes (Signed)
Pt is a 80 y.o. male who presents with  Chief Complaint  Patient presents with  . Shaking  Patient presents to the emergency room with new onset of rigors. Patient went to his primary doctor and had outpatient laboratory tests that included a urinalysis. He also had a chest x-ray. The patient denies any complaints of cough, chest pain, rash, headache or neck stiffness.  Physical Exam  Constitutional: No distress.  HENT:  Head: Normocephalic and atraumatic.  Eyes: Conjunctivae are normal. Left eye exhibits no discharge. No scleral icterus.  Neck: No tracheal deviation present. No thyromegaly present.  Pulmonary/Chest: Effort normal and breath sounds normal. No stridor.  Abdominal: He exhibits no distension.  Musculoskeletal: He exhibits no tenderness or deformity.  Neurological: He is alert.  Skin: Skin is warm. No rash noted. He is not diaphoretic. No erythema.  Psychiatric: Affect normal.    Clinical Course  Patient's laboratory tests are notable for a urinary tract infection. He does have bacteria and white blood cells in the urine.  Lactic acid levels not elevated however he does have renal insufficiency that is new and elevated white blood cell count. He has had a couple of borderline blood pressures. Consider these findings I'll consult the medical service regarding admission for IV abx    Rigors  UTI (lower urinary tract infection)  Renal insufficiency   Medical screening examination/treatment/procedure(s) were conducted as a shared visit with non-physician practitioner(s) and myself.  I personally evaluated the patient during the encounter.       Dorie Rank, MD 12/27/15 [phone]

## 2015-12-27 NOTE — H&P (Signed)
[facility] History and Physical  [name] [id number] DOB: [birth year] DOA: 12/27/2015  PCP: Walker Kehr, MD  Patient coming from: home  Chief Complaint: Shaking  HPI: [name] is a 80 y.o. male with a medical history of essential hypertension, presented to the emergency department with complaints of shaking and rigors. Patient started having the symptoms yesterday around 2 PM. He then called his PCP today and had an appointment at approximately 11 AM. He continued to have rigors there. UA was done showing urinary tract infection however given patient's condition, he was sent to emergency department for further assessment. Patient does endorse low-grade fever of 99 at home. He does state that he was dehydrated and noted that his urine was odorous. Currently denies any sick contacts or recent travel, chest pain, shortness of breath, abdominal pain, nausea or vomiting, headache or dizziness. Of note patient did have similar symptoms at the beginning of August at which point he had self diagnosed himself and taken some leftover amoxicillin. Patient did develop Stevens-Johnson syndrome.  ED Course: UA reobtained along with urine and blood cultures. Started on cipro. [facility] called for admission.  Review of Systems:  All other systems reviewed and are negative.   Past Medical History:  Diagnosis Date  . CHOLELITHIASIS 12/17/2007  . COLONIC POLYPS, ADENOMATOUS, HX OF 12/17/2007  . DEGENERATIVE DISC DISEASE, CERVICAL SPINE 02/19/2008  . [place] DISEASE, LUMBAR 12/17/2007  . HYPERTENSION 12/17/2007  . INTERNAL HEMORRHOIDS 12/17/2007  . Overweight(278.02) 02/19/2008  . POLIOMYELITIS, HX OF 12/17/2007  . RENAL CYST 12/17/2007  . SHINGLES, HX OF 12/17/2007    Past Surgical History:  Procedure Laterality Date  . Bilateral knee surgery     arthroscopy bilateral - Dr. Noemi Chapel  . CERVICAL FUSION    . HEMORRHOID SURGERY    . lumbar back surgery     two procedures: diskectomy and then  a fusion L4-5, L5-S1 Dr. Sherwood Gambler  . TONSILLECTOMY      Social History:  reports that he quit smoking about 10 years ago. He has never used smokeless tobacco. He reports that he drinks alcohol. He reports that he does not use drugs.  Allergies  Allergen Reactions  . Amoxicillin Swelling and Rash    Has patient had a PCN reaction causing immediate rash, facial/tongue/throat swelling, SOB or lightheadedness with hypotension: No Has patient had a PCN reaction causing severe rash involving mucus membranes or skin necrosis: No Has patient had a PCN reaction that required hospitalization No Has patient had a PCN reaction occurring within the last 10 years: No If all of the above answers are "NO", then may proceed with Cephalosporin use.    Family History  Problem Relation Age of Onset  . Cancer Neg Hx   . Diabetes Neg Hx   . Heart disease Neg Hx   . Hyperlipidemia Neg Hx     Prior to Admission medications   Medication Sig Start Date End Date Taking? Authorizing Provider  aspirin 81 MG tablet Take 81 mg by mouth daily.     Yes Historical Provider, MD  diazepam (VALIUM) 5 MG tablet TAKE 1 TABLET AT BEDTIME AS NEEDED. 10/11/15  Yes Evie Lacks Plotnikov, MD  diphenhydrAMINE (BENADRYL) 25 MG tablet Take 1 tablet by mouth 3 (three) times daily as needed. 2 by mouth at bedtime    Yes Historical Provider, MD  hydrochlorothiazide (HYDRODIURIL) 25 MG tablet TAKE 1 TABLET DAILY. 11/01/15  Yes Cassandria Anger, MD  Multiple Vitamin (MULTIVITAMIN) capsule Take 2  capsules by mouth daily.    Yes Historical Provider, MD  Omega-3 1000 MG CAPS Take 1 capsule by mouth daily.   Yes Historical Provider, MD  potassium chloride SA (K-DUR,KLOR-CON) 20 MEQ tablet Take 1 tablet (20 mEq total) by mouth daily. 11/01/15  Yes Evie Lacks Plotnikov, MD  vitamin C (ASCORBIC ACID) 500 MG tablet Take 500 mg by mouth daily.   Yes Historical Provider, MD  azithromycin (ZITHROMAX) 250 MG tablet As directed Patient not taking:  Reported on 12/27/2015 12/27/15   Cassandria Anger, MD  cholecalciferol (VITAMIN D) 1000 units tablet Take 1 tablet (1,000 Units total) by mouth daily. Patient not taking: Reported on 12/27/2015 11/01/15 10/31/16  Cassandria Anger, MD    Physical Exam: Vitals:   12/27/15 1530 12/27/15 1630  BP: 107/56 105/64  Pulse: 68 72  Resp:    Temp:       General: Well developed, well nourished, NAD, appears stated age  HEENT: NCAT, PERRLA, EOMI, Anicteic Sclera, mucous membranes moist.   Neck: Supple, no JVD, no masses  Cardiovascular: S1 S2 auscultated, no rubs, murmurs or gallops. Regular rate and rhythm.  Respiratory: Clear to auscultation bilaterally with equal chest rise  Abdomen: Soft, nontender, nondistended, + bowel sounds  Extremities: warm dry without cyanosis clubbing or edema  Neuro: AAOx3, cranial nerves grossly intact. Strength 5/5 in patient's upper and lower extremities bilaterally  Skin: Without rashes exudates or nodules  Psych: Normal affect and demeanor with intact judgement and insight  Labs on Admission: I have personally reviewed following labs and imaging studies CBC:  Recent Labs Lab 12/27/15 1150 12/27/15 1349  WBC 12.1* 11.1*  NEUTROABS 11.1* 10.2*  HGB 15.0 13.0  HCT 44.1 38.3*  MCV 93.1 93.2  PLT 200.0 123456   Basic Metabolic Panel:  Recent Labs Lab 12/27/15 1150 12/27/15 1349  NA 137 137  K 3.5 3.0*  CL 97 100*  CO2 33* 29  GLUCOSE 84 90  BUN 23 25*  CREATININE 1.55* 1.55*  CALCIUM 9.7 8.9   GFR: Estimated Creatinine Clearance: 44.5 mL/min (by C-G formula based on SCr of 1.55 mg/dL (H)). Liver Function Tests:  Recent Labs Lab 12/27/15 1150 12/27/15 1349  AST 27 26  ALT 23 19  ALKPHOS 51 41  BILITOT 1.3* 1.4*  PROT 7.5 6.5  ALBUMIN 4.0 3.4*   No results for input(s): LIPASE, AMYLASE in the last 168 hours. No results for input(s): AMMONIA in the last 168 hours. Coagulation Profile: No results for input(s): INR, PROTIME in  the last 168 hours. Cardiac Enzymes: No results for input(s): CKTOTAL, CKMB, CKMBINDEX, TROPONINI in the last 168 hours. BNP (last 3 results) No results for input(s): PROBNP in the last 8760 hours. HbA1C: No results for input(s): HGBA1C in the last 72 hours. CBG: No results for input(s): GLUCAP in the last 168 hours. Lipid Profile: No results for input(s): CHOL, HDL, LDLCALC, TRIG, CHOLHDL, LDLDIRECT in the last 72 hours. Thyroid Function Tests: No results for input(s): TSH, T4TOTAL, FREET4, T3FREE, THYROIDAB in the last 72 hours. Anemia Panel: No results for input(s): VITAMINB12, FOLATE, FERRITIN, TIBC, IRON, RETICCTPCT in the last 72 hours. Urine analysis:    Component Value Date/Time   COLORURINE YELLOW 12/27/2015 1600   APPEARANCEUR CLEAR 12/27/2015 1600   LABSPEC 1.011 12/27/2015 1600   PHURINE 6.0 12/27/2015 1600   GLUCOSEU NEGATIVE 12/27/2015 1600   GLUCOSEU NEGATIVE 12/27/2015 1150   HGBUR NEGATIVE 12/27/2015 1600   BILIRUBINUR NEGATIVE 12/27/2015 1600   KETONESUR NEGATIVE  12/27/2015 1600   PROTEINUR NEGATIVE 12/27/2015 1600   UROBILINOGEN 0.2 12/27/2015 1150   NITRITE NEGATIVE 12/27/2015 1600   LEUKOCYTESUR TRACE (A) 12/27/2015 1600   Sepsis Labs: @LABRCNTIP (procalcitonin:4,lacticidven:4) ) Recent Results (from the past 240 hour(s))  Blood culture (routine x 2)     Status: None (Preliminary result)   Collection Time: 12/27/15  2:19 PM  Result Value Ref Range Status   Specimen Description   Final    BLOOD LEFT ANTECUBITAL Performed at [facility]    Special Requests BOTTLES DRAWN AEROBIC AND ANAEROBIC 5CC  Final   Culture PENDING  Incomplete   Report Status PENDING  Incomplete  Blood culture (routine x 2)     Status: None (Preliminary result)   Collection Time: 12/27/15  2:21 PM  Result Value Ref Range Status   Specimen Description   Final    BLOOD RIGHT ANTECUBITAL Performed at [facility]    Special Requests BOTTLES DRAWN AEROBIC AND  ANAEROBIC 5CC  Final   Culture PENDING  Incomplete   Report Status PENDING  Incomplete     Radiological Exams on Admission: Dg Chest 2 View  Result Date: 12/27/2015 CLINICAL DATA:  Fever, chills. EXAM: CHEST  2 VIEW COMPARISON:  Radiographs of March 07, 2009. FINDINGS: The heart size and mediastinal contours are within normal limits. Both lungs are clear. No pneumothorax or pleural effusion is noted. Multilevel degenerative disc disease is noted in the thoracic spine. IMPRESSION: No active cardiopulmonary disease. Electronically Signed   By: Marijo Conception, M.D.   On: 12/27/2015 13:37    EKG: None  Assessment/Plan  Sepsis likely secondary to UTI -Upon admissionPatient did have leukocytosis of 12.1, fever of 101.64F -Patient presented with Reiter's shakes. -Chest x-ray unremarkable for infection -UA: Many bacteria, 6-30 WBCs, trace leukocytes -Urine culture pending -Blood cultures pending -Started on Cipro (patient does have allergy to amoxicillin) -Patient may want to follow up with urology as an outpatient.    Acute kidney injury on possible CKD, stage III -Based and creatinine 1.1-1.2 -Upon admission, creatinine 1.55 -Likely secondary to sepsis and dehydration -Placed on IV fluids, monitor BMP  Hypokalemia -Patient does take potassium supplementation at home -continue to replace potassium -monitor BMP and obtain magnesium level  Essential hypertension -Will hold HCTZ at this time given the patient has soft to normal blood pressure as well as AKI -Reassess need to start HCTZ given AKI and sepsis  DVT prophylaxis: Lovenox  Code Status: Full  Family Communication: Wife at bedside. Admission, patients condition and plan of care including tests being ordered have been discussed with the patient and wife who indicate understanding and agree with the plan and Code Status.  Disposition Plan: Admitted to medical floor.    Consults called: None   Admission status:  Admitted   Time spent: 70 minutes  Dexton Zwilling D.O. [facility] Pager [phone]  If 7PM-7AM, please contact night-coverage [email or web address] Password [facility] 12/27/2015, 5:04 PM

## 2015-12-27 NOTE — Patient Instructions (Addendum)
Tylenol as needed for fever Go to ER now

## 2015-12-27 NOTE — ED Triage Notes (Signed)
Patient sent over by primary from [place] with complaints of uncontrollable shaking. Reports that it started this morning when waking. Pain reports that muscles are tightening.

## 2015-12-28 ENCOUNTER — Inpatient Hospital Stay (HOSPITAL_COMMUNITY): Payer: Medicare Other

## 2015-12-28 ENCOUNTER — Encounter (HOSPITAL_COMMUNITY): Payer: Self-pay | Admitting: Radiology

## 2015-12-28 DIAGNOSIS — A419 Sepsis, unspecified organism: Principal | ICD-10-CM

## 2015-12-28 LAB — CBC
HEMATOCRIT: 39.2 % (ref 39.0–52.0)
Hemoglobin: 13.1 g/dL (ref 13.0–17.0)
MCH: 31.6 pg (ref 26.0–34.0)
MCHC: 33.4 g/dL (ref 30.0–36.0)
MCV: 94.5 fL (ref 78.0–100.0)
PLATELETS: 168 10*3/uL (ref 150–400)
RBC: 4.15 MIL/uL — ABNORMAL LOW (ref 4.22–5.81)
RDW: 14.1 % (ref 11.5–15.5)
WBC: 11.7 10*3/uL — ABNORMAL HIGH (ref 4.0–10.5)

## 2015-12-28 LAB — BASIC METABOLIC PANEL
Anion gap: 9 (ref 5–15)
BUN: 20 mg/dL (ref 6–20)
CHLORIDE: 103 mmol/L (ref 101–111)
CO2: 28 mmol/L (ref 22–32)
CREATININE: 1.29 mg/dL — AB (ref 0.61–1.24)
Calcium: 8.4 mg/dL — ABNORMAL LOW (ref 8.9–10.3)
GFR calc Af Amer: 60 mL/min — ABNORMAL LOW (ref >60)
GFR calc non Af Amer: 51 mL/min — ABNORMAL LOW (ref >60)
GLUCOSE: 113 mg/dL — AB (ref 65–99)
POTASSIUM: 3.2 mmol/L — AB (ref 3.5–5.1)
SODIUM: 140 mmol/L (ref 135–145)

## 2015-12-28 MED ORDER — POTASSIUM CHLORIDE CRYS ER 20 MEQ PO TBCR
40.0000 meq | EXTENDED_RELEASE_TABLET | Freq: Once | ORAL | Status: AC
  Administered 2015-12-28: 40 meq via ORAL
  Filled 2015-12-28: qty 2

## 2015-12-28 MED ORDER — SENNOSIDES-DOCUSATE SODIUM 8.6-50 MG PO TABS
2.0000 | ORAL_TABLET | Freq: Two times a day (BID) | ORAL | Status: DC
  Administered 2015-12-28 – 2015-12-29 (×2): 2 via ORAL
  Filled 2015-12-28 (×2): qty 2

## 2015-12-28 MED ORDER — BISACODYL 5 MG PO TBEC
10.0000 mg | DELAYED_RELEASE_TABLET | Freq: Once | ORAL | Status: AC
  Administered 2015-12-28: 10 mg via ORAL
  Filled 2015-12-28 (×2): qty 2

## 2015-12-28 NOTE — Progress Notes (Signed)
PROGRESS NOTE                                                                                                                                                                                                             Patient Demographics:    [name], is a 80 y.o. male, DOB - 1935-07-28, [id number]  Admit date - 12/27/2015   Admitting Physician Cristal Ford, DO  Outpatient Primary MD for the patient is Walker Kehr, MD  LOS - 1   Chief Complaint  Patient presents with  . Shaking       Brief Narrative   80 year old male with history of hypertension, presents with fever, workup significant for UTI.   Subjective:    [name] today has, No headache, No chest pain, No abdominal pain - No NauseaReports he is feeling better today, but still generally weak, MAXIMUM TEMPERATURE of 101 this a.m..  Assessment  & Plan :    Active Problems:   Essential hypertension   Rigors   Sepsis ([place])   Hypokalemia  Sepsis likely secondary to UTI - Upon admissionPatient did have leukocytosis of 12.1, fever of 101.28F -Chest x-ray unremarkable for infection -UA: Many bacteria, 6-30 WBCs, trace leukocytes -Urine culture pending -Blood cultures pending -[place] with IV ciprofloxacin patient does have allergy to amoxicillin) -Patient may want to follow up with urology as an outpatient. , Will obtain CT renal protocol given patient had fever last month(thought to be secondary to pharyngitis given)  Acute kidney injury on possible CKD, stage III -Based and creatinine 1.1-1.2 -Upon admission, creatinine 1.55, improving with IV fluids  Hypokalemia -Repleting, as well continue with home supplements  Essential hypertension -Will hold HCTZ at this time given the patient has soft to normal blood pressure as well as AKI -Reassess need to start HCTZ given AKI and sepsis     Code Status : Full  Family Communication  : D/W patient , none at  bedside  Disposition Plan  : home when stable, will consult PT  Consults  :  none  Procedures  : None  DVT Prophylaxis  :  Lovenox  Lab Results  Component Value Date   PLT 168 12/28/2015    Antibiotics  :    Anti-infectives    Start     Dose/Rate Route Frequency Ordered Stop   12/28/15  0600  ciprofloxacin (CIPRO) IVPB 400 mg  Status:  Discontinued     400 mg 200 mL/hr over 60 Minutes Intravenous Every 12 hours 12/27/15 1804 12/27/15 1807   12/27/15 1600  ciprofloxacin (CIPRO) IVPB 400 mg     400 mg 200 mL/hr over 60 Minutes Intravenous Every 12 hours 12/27/15 1554          Objective:   Vitals:   12/27/15 2300 12/28/15 0132 12/28/15 0609 12/28/15 0806  BP:   (!) 93/54   Pulse:   81   Resp:   16   Temp: 100 F (37.8 C) 99 F (37.2 C) (!) 101 F (38.3 C) 98.8 F (37.1 C)  TempSrc:   Oral Oral  SpO2:   95%   Weight:      Height:        Wt Readings from Last 3 Encounters:  12/27/15 91.6 kg (202 lb)  12/27/15 94.3 kg (208 lb)  12/02/15 93 kg (205 lb)     Intake/Output Summary (Last 24 hours) at 12/28/15 1153 Last data filed at 12/27/15 1848  Gross per 24 hour  Intake              300 ml  Output                0 ml  Net              300 ml     Physical Exam  Awake Alert, Oriented X 3, No new F.N deficits, Normal affect [place].AT,PERRAL Supple Neck,No JVD,   Symmetrical Chest wall movement, Good air movement bilaterally, CTAB RRR,No Gallops,Rubs or new Murmurs, No Parasternal Heave +ve B.Sounds, Abd Soft, No tenderness, No organomegaly appriciated, No rebound - guarding or rigidity. No Cyanosis, Clubbing or edema, No new Rash or bruise      Data Review:    CBC  Recent Labs Lab 12/27/15 1150 12/27/15 1349 12/28/15 0423  WBC 12.1* 11.1* 11.7*  HGB 15.0 13.0 13.1  HCT 44.1 38.3* 39.2  PLT 200.0 154 168  MCV 93.1 93.2 94.5  MCH  --  31.6 31.6  MCHC 33.9 33.9 33.4  RDW 13.9 13.9 14.1  LYMPHSABS 0.8 0.4*  --   MONOABS 0.2 0.4  --   EOSABS 0.0  0.0  --   BASOSABS 0.0 0.0  --     Chemistries   Recent Labs Lab 12/27/15 1150 12/27/15 1349 12/28/15 0423  NA 137 137 140  K 3.5 3.0* 3.2*  CL 97 100* 103  CO2 33* 29 28  GLUCOSE 84 90 113*  BUN 23 25* 20  CREATININE 1.55* 1.55* 1.29*  CALCIUM 9.7 8.9 8.4*  MG  --  2.1  --   AST 27 26  --   ALT 23 19  --   ALKPHOS 51 41  --   BILITOT 1.3* 1.4*  --    ------------------------------------------------------------------------------------------------------------------ No results for input(s): CHOL, HDL, LDLCALC, TRIG, CHOLHDL, LDLDIRECT in the last 72 hours.  No results found for: HGBA1C ------------------------------------------------------------------------------------------------------------------ No results for input(s): TSH, T4TOTAL, T3FREE, THYROIDAB in the last 72 hours.  Invalid input(s): FREET3 ------------------------------------------------------------------------------------------------------------------ No results for input(s): VITAMINB12, FOLATE, FERRITIN, TIBC, IRON, RETICCTPCT in the last 72 hours.  Coagulation profile No results for input(s): INR, PROTIME in the last 168 hours.  No results for input(s): DDIMER in the last 72 hours.  Cardiac Enzymes No results for input(s): CKMB, TROPONINI, MYOGLOBIN in the last 168 hours.  Invalid input(s): CK ------------------------------------------------------------------------------------------------------------------ No results  found for: BNP  Inpatient Medications  Scheduled Meds: . aspirin  81 mg Oral Daily  . ciprofloxacin  400 mg Intravenous Q12H  . enoxaparin (LOVENOX) injection  40 mg Subcutaneous Q24H  . multivitamin with minerals  2 tablet Oral Daily  . omega-3 acid ethyl esters  1 g Oral Daily  . potassium chloride SA  20 mEq Oral Daily  . vitamin C  500 mg Oral Daily   Continuous Infusions: . sodium chloride 75 mL/hr at 12/27/15 1809   PRN Meds:.acetaminophen **OR** acetaminophen,  diphenhydrAMINE, ondansetron **OR** ondansetron (ZOFRAN) IV  Micro Results Recent Results (from the past 240 hour(s))  Blood culture (routine x 2)     Status: None (Preliminary result)   Collection Time: 12/27/15  2:19 PM  Result Value Ref Range Status   Specimen Description   Final    BLOOD LEFT ANTECUBITAL Performed at [facility]    Special Requests BOTTLES DRAWN AEROBIC AND ANAEROBIC 5CC  Final   Culture PENDING  Incomplete   Report Status PENDING  Incomplete  Blood culture (routine x 2)     Status: None (Preliminary result)   Collection Time: 12/27/15  2:21 PM  Result Value Ref Range Status   Specimen Description   Final    BLOOD RIGHT ANTECUBITAL Performed at [facility]    Special Requests BOTTLES DRAWN AEROBIC AND ANAEROBIC 5CC  Final   Culture PENDING  Incomplete   Report Status PENDING  Incomplete    Radiology Reports Dg Chest 2 View  Result Date: 12/27/2015 CLINICAL DATA:  Fever, chills. EXAM: CHEST  2 VIEW COMPARISON:  Radiographs of March 07, 2009. FINDINGS: The heart size and mediastinal contours are within normal limits. Both lungs are clear. No pneumothorax or pleural effusion is noted. Multilevel degenerative disc disease is noted in the thoracic spine. IMPRESSION: No active cardiopulmonary disease. Electronically Signed   By: Marijo Conception, M.D.   On: 12/27/2015 13:37     Reynaldo Rossman M.D on 12/28/2015 at 11:53 AM  Between 7am to 7pm - Pager - [phone]  After 7pm go to [email or web address] - password [email or web address]  [facility] -  Office  [phone]

## 2015-12-29 LAB — URINE CULTURE

## 2015-12-29 MED ORDER — SACCHAROMYCES BOULARDII 250 MG PO CAPS: 250.0000 mg | ORAL_CAPSULE | Freq: Two times a day (BID) | ORAL | 0 refills | Status: DC

## 2015-12-29 MED ORDER — HYDROCHLOROTHIAZIDE 25 MG PO TABS: 25.0000 mg | ORAL_TABLET | Freq: Every day | ORAL | 0 refills | Status: DC

## 2015-12-29 MED ORDER — CIPROFLOXACIN HCL 500 MG PO TABS: 500.0000 mg | ORAL_TABLET | Freq: Two times a day (BID) | ORAL | 0 refills | Status: AC

## 2015-12-29 MED ORDER — POTASSIUM CHLORIDE CRYS ER 20 MEQ PO TBCR
40.0000 meq | EXTENDED_RELEASE_TABLET | Freq: Once | ORAL | Status: AC
  Administered 2015-12-29: 40 meq via ORAL
  Filled 2015-12-29: qty 2

## 2015-12-29 NOTE — Care Management Note (Signed)
Case Management Note  Patient Details  Name: [name] MRN: [id number] Date of Birth: Aug 22, 1935  Subjective/Objective:  80 y.o. M from private residence with spouse.                   Action/Plan: Discharge home today with no CM needs. Will be available should disposition/discharge needs arise.    Expected Discharge Date:   (unknown)               Expected Discharge Plan:  Home/Self Care  In-House Referral:  NA  Discharge planning Services  CM Consult  Post Acute Care Choice:  NA Choice offered to:  NA  DME Arranged:  N/A DME Agency:  NA  HH Arranged:  NA HH Agency:     Status of Service:  Completed, signed off  If discussed at [place] of Stay Meetings, dates discussed:    Additional Comments:  Delrae Sawyers, RN 12/29/2015, 2:44 PM

## 2015-12-29 NOTE — Discharge Summary (Signed)
[name], is a 80 y.o. male  DOB 25-Jul-1935  MRN [id number].  Admission date:  12/27/2015  Admitting Physician  Cristal Ford, DO  Discharge Date:  12/29/2015   Primary MD  Walker Kehr, MD  Recommendations for primary care physician for things to follow:  - Please check CBC, BMP during next visit - Follow-up on final results of blood cultures obtained during hospital stay  Admission Diagnosis  Rigors [R68.89] UTI (lower urinary tract infection) [N39.0] Renal insufficiency [N28.9]   Discharge Diagnosis  Rigors [R68.89] UTI (lower urinary tract infection) [N39.0] Renal insufficiency [N28.9]    Active Problems:   Essential hypertension   Rigors   Sepsis ([place])   Hypokalemia      Past Medical History:  Diagnosis Date  . CHOLELITHIASIS 12/17/2007  . COLONIC POLYPS, ADENOMATOUS, HX OF 12/17/2007  . DEGENERATIVE DISC DISEASE, CERVICAL SPINE 02/19/2008  . [place] DISEASE, LUMBAR 12/17/2007  . HYPERTENSION 12/17/2007  . INTERNAL HEMORRHOIDS 12/17/2007  . Overweight(278.02) 02/19/2008  . POLIOMYELITIS, HX OF 12/17/2007  . RENAL CYST 12/17/2007  . SHINGLES, HX OF 12/17/2007    Past Surgical History:  Procedure Laterality Date  . Bilateral knee surgery     arthroscopy bilateral - Dr. Noemi Chapel  . CERVICAL FUSION    . HEMORRHOID SURGERY    . lumbar back surgery     two procedures: diskectomy and then a fusion L4-5, L5-S1 Dr. Sherwood Gambler  . TONSILLECTOMY         History of present illness and  Hospital Course:     Kindly see H&P for history of present illness and admission details, please review complete Labs, Consult reports and Test reports for all details in brief  HPI  from the history and physical done on the day of admission 12/27/2015 HPI: [name] is a 80 y.o. male with a medical history of essential hypertension, presented to the emergency department with complaints of shaking  and rigors. Patient started having the symptoms yesterday around 2 PM. He then called his PCP today and had an appointment at approximately 11 AM. He continued to have rigors there. UA was done showing urinary tract infection however given patient's condition, he was sent to emergency department for further assessment. Patient does endorse low-grade fever of 99 at home. He does state that he was dehydrated and noted that his urine was odorous. Currently denies any sick contacts or recent travel, chest pain, shortness of breath, abdominal pain, nausea or vomiting, headache or dizziness. Of note patient did have similar symptoms at the beginning of August at which point he had self diagnosed himself and taken some leftover amoxicillin. Patient did develop Stevens-Johnson syndrome.  ED Course: UA reobtained along with urine and blood cultures. Started on cipro. [facility] called for admission.   Hospital Course  80 year old male with history of hypertension, presents with fever, workup significant for UTI.  Sepsis likely secondary to UTI - Upon admissionPatient did have leukocytosis of 12.1, fever of 101.50F -Chest x-ray unremarkable for infection -UA: Many bacteria,  6-30 WBCs, trace leukocytes -Urine culture growing Citrobacter koseri pansensitive, treated with total of 3 days of IV ciprofloxacin, to finish another 7 days on by mouth Cipro as an outpatient - Blood culture with no growth to date at time of discharge - CT renal stone study was obtained, no evidence of renal stone or hydronephrosis, was significant for renal cyst, as well had abnormal finding in gallbladder, with probable cholelithiasis, possible acute cholecystitis, patient denies any abdominal pain, LFTs within normal limits, ultrasound right upper quadrant was obtained, with no evidence of cholecystitis, only significant for gallstones(D/W radiology)  Acute kidney injury on possible CKD, stage III -Based and creatinine 1.1-1.2 -Upon  admission, creatinine 1.55, back to baseline  with IV fluids  Hypokalemia -Repleted  Essential hypertension - Held  HCTZ during hospital stay due to soft to normal blood pressure as well as AKI, can be resumed as outatient in 3 days.       Discharge Condition: Stable  Follow UP  Follow-up Information    Walker Kehr, MD Follow up in 1 week(s).   Specialty:  Internal Medicine Contact information: [address] [place] [address] [phone]             Discharge Instructions  and  Discharge Medications     Discharge Instructions    Diet - low sodium heart healthy    Complete by:  As directed    Discharge instructions    Complete by:  As directed    ollow with Primary MD Walker Kehr, MD in 7 days   Get CBC, CMP,checked  by Primary MD next visit.    Activity: As tolerated with Full fall precautions use walker/cane & assistance as needed   Disposition Home    Diet: Heart Healthy  , with feeding assistance and aspiration precautions.  For Heart failure patients - Check your Weight same time everyday, if you gain over 2 pounds, or you develop in leg swelling, experience more shortness of breath or chest pain, call your Primary MD immediately. Follow Cardiac Low Salt Diet and 1.5 lit/day fluid restriction.   On your next visit with your primary care physician please Get Medicines reviewed and adjusted.   Please request your Prim.MD to go over all Hospital Tests and Procedure/Radiological results at the follow up, please get all Hospital records sent to your Prim MD by signing hospital release before you go home.   If you experience worsening of your admission symptoms, develop shortness of breath, life threatening emergency, suicidal or homicidal thoughts you must seek medical attention immediately by calling 911 or calling your MD immediately  if symptoms less severe.  You Must read complete instructions/literature along with all the possible  adverse reactions/side effects for all the Medicines you take and that have been prescribed to you. Take any new Medicines after you have completely understood and accpet all the possible adverse reactions/side effects.   Do not drive, operating heavy machinery, perform activities at heights, swimming or participation in water activities or provide baby sitting services if your were admitted for syncope or siezures until you have seen by Primary MD or a Neurologist and advised to do so again.  Do not drive when taking Pain medications.    Do not take more than prescribed Pain, Sleep and Anxiety Medications  Special Instructions: If you have smoked or chewed Tobacco  in the last 2 yrs please stop smoking, stop any regular Alcohol  and or any Recreational drug use.  Wear Seat  belts while driving.   Please note  You were cared for by a hospitalist during your hospital stay. If you have any questions about your discharge medications or the care you received while you were in the hospital after you are discharged, you can call the unit and asked to speak with the hospitalist on call if the hospitalist that took care of you is not available. Once you are discharged, your primary care physician will handle any further medical issues. Please note that NO REFILLS for any discharge medications will be authorized once you are discharged, as it is imperative that you return to your primary care physician (or establish a relationship with a primary care physician if you do not have one) for your aftercare needs so that they can reassess your need for medications and monitor your lab values.   Increase activity slowly    Complete by:  As directed        Medication List    STOP taking these medications   azithromycin 250 MG tablet Commonly known as:  ZITHROMAX     TAKE these medications   aspirin 81 MG tablet Take 81 mg by mouth daily.   cholecalciferol 1000 units tablet Commonly known as:   VITAMIN D Take 1 tablet (1,000 Units total) by mouth daily.   ciprofloxacin 500 MG tablet Commonly known as:  CIPRO Take 1 tablet (500 mg total) by mouth 2 (two) times daily. Start taking on:  12/30/2015   diazepam 5 MG tablet Commonly known as:  VALIUM TAKE 1 TABLET AT BEDTIME AS NEEDED.   diphenhydrAMINE 25 MG tablet Commonly known as:  BENADRYL Take 1 tablet by mouth 3 (three) times daily as needed. 2 by mouth at bedtime   hydrochlorothiazide 25 MG tablet Commonly known as:  HYDRODIURIL Take 1 tablet (25 mg total) by mouth daily. Please start taking from 01/01/2016 Start taking on:  01/01/2016 What changed:  See the new instructions.   multivitamin capsule Take 2 capsules by mouth daily.   Omega-3 1000 MG Caps Take 1 capsule by mouth daily.   potassium chloride SA 20 MEQ tablet Commonly known as:  K-DUR,KLOR-CON Take 1 tablet (20 mEq total) by mouth daily.   saccharomyces boulardii 250 MG capsule Commonly known as:  FLORASTOR Take 1 capsule (250 mg total) by mouth 2 (two) times daily.   vitamin C 500 MG tablet Commonly known as:  ASCORBIC ACID Take 500 mg by mouth daily.         Diet and Activity recommendation: See Discharge Instructions above   Consults obtained -  note   Major procedures and Radiology Reports - PLEASE review detailed and final reports for all details, in brief -      Dg Chest 2 View  Result Date: 12/27/2015 CLINICAL DATA:  Fever, chills. EXAM: CHEST  2 VIEW COMPARISON:  Radiographs of March 07, 2009. FINDINGS: The heart size and mediastinal contours are within normal limits. Both lungs are clear. No pneumothorax or pleural effusion is noted. Multilevel degenerative disc disease is noted in the thoracic spine. IMPRESSION: No active cardiopulmonary disease. Electronically Signed   By: Marijo Conception, M.D.   On: 12/27/2015 13:37   Ct Renal Stone Study  Result Date: 12/28/2015 CLINICAL DATA:  Sepsis due to urinary tract infection,  fever, on antibiotics for 2 days. EXAM: CT ABDOMEN AND PELVIS WITHOUT CONTRAST TECHNIQUE: Multidetector CT imaging of the abdomen and pelvis was performed following the standard protocol without IV contrast. COMPARISON:  None.  FINDINGS: Lower chest: No acute abnormality. Hepatobiliary: Gallbladder walls are indistinct, likely thickened. Suspect cholelithiasis. Small focus of air in the gallbladder may be trapped within a gallstone. No bile duct dilatation seen. Liver appears within normal limits for a noncontrast study. Pancreas: Unremarkable. No pancreatic ductal dilatation or surrounding inflammatory changes. Spleen: Normal in size without focal abnormality. Adrenals/Urinary Tract: Adrenal glands appear normal. Hypodense masses within each kidney, largest measuring 4 cm located within the left kidney posteriorly is consistent with a simple cyst by CT density criteria. No renal stone or hydronephrosis. No perinephric fluid. No ureteral or bladder calculi identified. Bladder is unremarkable, partially decompressed. Stomach/Bowel: Bowel is normal in caliber. No bowel wall thickening or evidence of bowel wall inflammation seen. Appendix is normal. Stomach appears normal. Vascular/Lymphatic: Scattered atherosclerotic changes of the normal caliber abdominal aorta. No enlarged lymph nodes seen in the abdomen or pelvis. Reproductive: Prostate gland is enlarged causing slight mass effect on the bladder base. Otherwise unremarkable. Other: No free fluid or abscess collection seen. No free intraperitoneal air. Musculoskeletal: Degenerative changes throughout the thoracolumbar spine, at least moderate in degree at the L2 through S1 levels. Associated disc-osteophytic bulge at the L3-4 level causing a focal moderate-to-severe central canal stenosis. Patient is status post a left hemilaminectomy at this level. Posterior pedicle fusion hardware at the lumbosacral junction appears intact and appropriately aligned. No acute or  suspicious osseous finding. Superficial soft tissues are unremarkable. IMPRESSION: 1. Gallbladder walls are indistinct and likely thickened suggesting acute cholecystitis. Probable cholelithiasis. Single focus of air within the gallbladder is most likely trapped within a gallstone (chronic), however, developing emphysematous cholecystitis cannot be definitively excluded based on this exam. Recommend further characterization with right upper quadrant ultrasound. 2. Renal cysts. No renal stone or hydronephrosis. No evidence of pyelonephritis seen although characterization is limited by the lack of IV contrast. Bladder is decompressed 3. Prostate enlargement. 4. Degenerative and surgical changes of the lumbar spine, as detailed above. 5. Aortic atherosclerosis. These results were called by telephone at the time of interpretation on 12/28/2015 at 2:51 pm to Dr. Phillips Climes , who verbally acknowledged these results. Electronically Signed   By: Franki Cabot M.D.   On: 12/28/2015 14:54   US Abdomen Limited Ruq  Result Date: 12/28/2015 CLINICAL DATA:  History of gallstones.  Fever.  No pain. EXAM: US ABDOMEN LIMITED - RIGHT UPPER QUADRANT COMPARISON:  CT, 12/28/2015 at 1420 hours FINDINGS: Gallbladder: Multiple shadowing gallstones. Gallbladder wall is borderline thickened at 3.7 mm. No pericholecystic fluid. No sonographic Murphy's sign. Common bile duct: Diameter: 4.2 mm Liver: No focal lesion identified. Within normal limits in parenchymal echogenicity. IMPRESSION: 1. Multiple gallstones.  Note evidence of acute cholecystitis. 2. No bile duct dilation.  Normal appearance of the liver. Electronically Signed   By: Lajean Manes M.D.   On: 12/28/2015 21:44    Micro Results    Recent Results (from the past 240 hour(s))  Blood culture (routine single)     Status: None (Preliminary result)   Collection Time: 12/27/15 11:50 AM  Result Value Ref Range Status   Preliminary Report   Preliminary    No Growth to  Date.Culture is examined daily for a total of 7 days of incubation.A change in status will result in a phone report followed by an updated printed report.   Blood culture (routine x 2)     Status: None (Preliminary result)   Collection Time: 12/27/15  2:19 PM  Result Value Ref Range Status  Specimen Description BLOOD LEFT ANTECUBITAL  Final   Special Requests BOTTLES DRAWN AEROBIC AND ANAEROBIC 5CC  Final   Culture   Final    NO GROWTH < 24 HOURS Performed at [facility]    Report Status PENDING  Incomplete  Blood culture (routine x 2)     Status: None (Preliminary result)   Collection Time: 12/27/15  2:21 PM  Result Value Ref Range Status   Specimen Description BLOOD RIGHT ANTECUBITAL  Final   Special Requests BOTTLES DRAWN AEROBIC AND ANAEROBIC 5CC  Final   Culture   Final    NO GROWTH < 24 HOURS Performed at [facility]    Report Status PENDING  Incomplete  Urine culture     Status: Abnormal   Collection Time: 12/27/15  3:50 PM  Result Value Ref Range Status   Specimen Description URINE, CLEAN CATCH  Final   Special Requests NONE  Final   Culture >=100,000 COLONIES/mL CITROBACTER KOSERI (A)  Final   Report Status 12/29/2015 FINAL  Final   Organism ID, Bacteria CITROBACTER KOSERI (A)  Final      Susceptibility   Citrobacter koseri - MIC*    CEFAZOLIN <=4 SENSITIVE Sensitive     CEFTRIAXONE <=1 SENSITIVE Sensitive     CIPROFLOXACIN <=0.25 SENSITIVE Sensitive     GENTAMICIN <=1 SENSITIVE Sensitive     IMIPENEM <=0.25 SENSITIVE Sensitive     NITROFURANTOIN <=16 SENSITIVE Sensitive     TRIMETH/SULFA <=20 SENSITIVE Sensitive     PIP/TAZO <=4 SENSITIVE Sensitive     * >=100,000 COLONIES/mL CITROBACTER KOSERI       Today   Subjective:   [name] today has no headache,no chest abdominal pain,no new weakness tingling or numbness, feels much better wants to go home today.   Objective:   Blood pressure 126/89, pulse 72, temperature 98.1 F (36.7  C), temperature source Oral, resp. rate 14, height 5\' 9"  (1.753 m), weight 91.7 kg (202 lb 2.6 oz), SpO2 100 %.   Intake/Output Summary (Last 24 hours) at 12/29/15 1326 Last data filed at 12/29/15 1047  Gross per 24 hour  Intake             1150 ml  Output                0 ml  Net             1150 ml    Exam Awake Alert, Oriented x 3, No new F.N deficits, Normal affect Wilder.AT,PERRAL Supple Neck,No JVD, No cervical lymphadenopathy appriciated.  Symmetrical Chest wall movement, Good air movement bilaterally, CTAB RRR,No Gallops,Rubs or new Murmurs, No Parasternal Heave +ve B.Sounds, Abd Soft, Non tender, No organomegaly appriciated, No rebound -guarding or rigidity. No Cyanosis, Clubbing or edema, No new Rash or bruise  Data Review   CBC w Diff:  Lab Results  Component Value Date   WBC 11.7 (H) 12/28/2015   HGB 13.1 12/28/2015   HCT 39.2 12/28/2015   PLT 168 12/28/2015   LYMPHOPCT 4 12/27/2015   MONOPCT 4 12/27/2015   EOSPCT 0 12/27/2015   BASOPCT 0 12/27/2015    CMP:  Lab Results  Component Value Date   NA 140 12/28/2015   K 3.2 (L) 12/28/2015   CL 103 12/28/2015   CO2 28 12/28/2015   BUN 20 12/28/2015   CREATININE 1.29 (H) 12/28/2015   PROT 6.5 12/27/2015   ALBUMIN 3.4 (L) 12/27/2015   BILITOT 1.4 (H) 12/27/2015   ALKPHOS  41 12/27/2015   AST 26 12/27/2015   ALT 19 12/27/2015  .   Total Time in preparing paper work, data evaluation and todays exam - 35 minutes  [name] M.D on 12/29/2015 at 1:26 PM  [facility]   Office  [phone]

## 2015-12-29 NOTE — Progress Notes (Signed)
Pt discharged home in stable condition. Discharge instructions and scripts given. Pt verbalized understanding. No immediate questions/concerns at this time.

## 2015-12-29 NOTE — Discharge Instructions (Signed)
Follow with Primary MD Walker Kehr, MD in 7 days   Get CBC, CMP,checked  by Primary MD next visit.    Activity: As tolerated with Full fall precautions use walker/cane & assistance as needed   Disposition Home    Diet: Heart Healthy  , with feeding assistance and aspiration precautions.  For Heart failure patients - Check your Weight same time everyday, if you gain over 2 pounds, or you develop in leg swelling, experience more shortness of breath or chest pain, call your Primary MD immediately. Follow Cardiac Low Salt Diet and 1.5 lit/day fluid restriction.   On your next visit with your primary care physician please Get Medicines reviewed and adjusted.   Please request your Prim.MD to go over all Hospital Tests and Procedure/Radiological results at the follow up, please get all Hospital records sent to your Prim MD by signing hospital release before you go home.   If you experience worsening of your admission symptoms, develop shortness of breath, life threatening emergency, suicidal or homicidal thoughts you must seek medical attention immediately by calling 911 or calling your MD immediately  if symptoms less severe.  You Must read complete instructions/literature along with all the possible adverse reactions/side effects for all the Medicines you take and that have been prescribed to you. Take any new Medicines after you have completely understood and accpet all the possible adverse reactions/side effects.   Do not drive, operating heavy machinery, perform activities at heights, swimming or participation in water activities or provide baby sitting services if your were admitted for syncope or siezures until you have seen by Primary MD or a Neurologist and advised to do so again.  Do not drive when taking Pain medications.    Do not take more than prescribed Pain, Sleep and Anxiety Medications  Special Instructions: If you have smoked or chewed Tobacco  in the last 2 yrs please  stop smoking, stop any regular Alcohol  and or any Recreational drug use.  Wear Seat belts while driving.   Please note  You were cared for by a hospitalist during your hospital stay. If you have any questions about your discharge medications or the care you received while you were in the hospital after you are discharged, you can call the unit and asked to speak with the hospitalist on call if the hospitalist that took care of you is not available. Once you are discharged, your primary care physician will handle any further medical issues. Please note that NO REFILLS for any discharge medications will be authorized once you are discharged, as it is imperative that you return to your primary care physician (or establish a relationship with a primary care physician if you do not have one) for your aftercare needs so that they can reassess your need for medications and monitor your lab values.

## 2015-12-30 ENCOUNTER — Encounter: Payer: Self-pay | Admitting: Nurse Practitioner

## 2015-12-30 ENCOUNTER — Encounter: Payer: Self-pay | Admitting: Internal Medicine

## 2015-12-30 ENCOUNTER — Telehealth: Payer: Self-pay | Admitting: *Deleted

## 2015-12-30 NOTE — Telephone Encounter (Signed)
Transition Care Management Follow-up Telephone Call   Date discharged? 12/29/15   How have you been since you were released from the hospital? Pt states he is feeling ok   Do you understand why you were in the hospital? YES   Do you understand the discharge instructions? YES   Where were you discharged to? Home   Items Reviewed:  Medications reviewed: YES  Allergies reviewed: YES  Dietary changes reviewed: YES  Referrals reviewed: No referral needed   Functional Questionnaire:   Activities of Daily Living (ADLs):   He states he are independent in the following: ambulation, bathing and hygiene, feeding, continence, grooming, toileting and dressing States he doesn't require assistance    Any transportation issues/concerns?: NO   Any patient concerns? YES   Confirmed importance and date/time of follow-up visits scheduled YES, 01/20/16- Pt made appt himself on mychart he states that's the soonest he can come  Provider Appointment booked with Dr. Alain Marion  Confirmed with patient if condition begins to worsen call PCP or go to the ER.  Patient was given the office number and encouraged to call back with question or concerns.  : YES

## 2016-01-01 LAB — CULTURE, BLOOD (ROUTINE X 2)
Culture: NO GROWTH
Culture: NO GROWTH

## 2016-01-03 LAB — CULTURE, BLOOD (SINGLE): ORGANISM ID, BACTERIA: NO GROWTH

## 2016-01-05 ENCOUNTER — Ambulatory Visit: Payer: Self-pay | Admitting: Internal Medicine

## 2016-01-17 ENCOUNTER — Ambulatory Visit (INDEPENDENT_AMBULATORY_CARE_PROVIDER_SITE_OTHER): Payer: Medicare Other

## 2016-01-17 DIAGNOSIS — Z23 Encounter for immunization: Secondary | ICD-10-CM

## 2016-01-20 ENCOUNTER — Ambulatory Visit: Payer: Self-pay | Admitting: Internal Medicine

## 2016-01-31 ENCOUNTER — Other Ambulatory Visit: Payer: Self-pay | Admitting: Internal Medicine

## 2016-02-13 ENCOUNTER — Other Ambulatory Visit: Payer: Self-pay | Admitting: Internal Medicine

## 2016-02-14 NOTE — Telephone Encounter (Signed)
Refill called into pharmacy spoke w/Stacey gave md approval.../lmb

## 2016-04-12 DIAGNOSIS — L821 Other seborrheic keratosis: Secondary | ICD-10-CM | POA: Diagnosis not present

## 2016-04-12 DIAGNOSIS — L570 Actinic keratosis: Secondary | ICD-10-CM | POA: Diagnosis not present

## 2016-04-12 DIAGNOSIS — D485 Neoplasm of uncertain behavior of skin: Secondary | ICD-10-CM | POA: Diagnosis not present

## 2016-04-12 DIAGNOSIS — L814 Other melanin hyperpigmentation: Secondary | ICD-10-CM | POA: Diagnosis not present

## 2016-04-12 DIAGNOSIS — D1801 Hemangioma of skin and subcutaneous tissue: Secondary | ICD-10-CM | POA: Diagnosis not present

## 2016-04-12 DIAGNOSIS — Z85828 Personal history of other malignant neoplasm of skin: Secondary | ICD-10-CM | POA: Diagnosis not present

## 2016-04-12 DIAGNOSIS — D225 Melanocytic nevi of trunk: Secondary | ICD-10-CM | POA: Diagnosis not present

## 2016-04-12 DIAGNOSIS — D2271 Melanocytic nevi of right lower limb, including hip: Secondary | ICD-10-CM | POA: Diagnosis not present

## 2016-04-12 DIAGNOSIS — D692 Other nonthrombocytopenic purpura: Secondary | ICD-10-CM | POA: Diagnosis not present

## 2016-04-12 DIAGNOSIS — L720 Epidermal cyst: Secondary | ICD-10-CM | POA: Diagnosis not present

## 2016-05-07 ENCOUNTER — Encounter: Payer: Self-pay | Admitting: Gastroenterology

## 2016-05-18 ENCOUNTER — Other Ambulatory Visit: Payer: Self-pay | Admitting: Internal Medicine

## 2016-06-03 ENCOUNTER — Other Ambulatory Visit: Payer: Self-pay | Admitting: Internal Medicine

## 2016-06-04 NOTE — Telephone Encounter (Signed)
MD out of office pls advise on refill.../lmb 

## 2016-06-04 NOTE — Telephone Encounter (Signed)
[place] controlled substance database checked.  Ok to fill medication.  No refills 

## 2016-06-05 ENCOUNTER — Other Ambulatory Visit: Payer: Self-pay | Admitting: Internal Medicine

## 2016-06-05 NOTE — Telephone Encounter (Signed)
Called refill into [facility] left on pharmacist vm...[name]

## 2016-07-06 ENCOUNTER — Other Ambulatory Visit: Payer: Self-pay | Admitting: Internal Medicine

## 2016-07-10 ENCOUNTER — Other Ambulatory Visit: Payer: Self-pay | Admitting: Internal Medicine

## 2016-07-10 ENCOUNTER — Encounter: Payer: Self-pay | Admitting: Internal Medicine

## 2016-07-10 MED ORDER — DIAZEPAM 5 MG PO TABS: 5.0000 mg | ORAL_TABLET | Freq: Every evening | ORAL | 1 refills | Status: DC | PRN

## 2016-07-10 NOTE — Telephone Encounter (Signed)
rx faxed to [facility].

## 2016-07-10 NOTE — Addendum Note (Signed)
Addended by: Aviva Signs M on: 07/10/2016 04:56 PM   Modules accepted: Orders

## 2016-07-11 ENCOUNTER — Encounter: Payer: Self-pay | Admitting: Internal Medicine

## 2016-09-10 ENCOUNTER — Other Ambulatory Visit: Payer: Self-pay | Admitting: Internal Medicine

## 2016-09-14 NOTE — Telephone Encounter (Signed)
Called refill into [facility] had to leave on pharmacy vm...[name]

## 2016-10-01 DIAGNOSIS — H1033 Unspecified acute conjunctivitis, bilateral: Secondary | ICD-10-CM | POA: Diagnosis not present

## 2016-10-02 ENCOUNTER — Encounter: Payer: Self-pay | Admitting: Internal Medicine

## 2016-10-04 ENCOUNTER — Ambulatory Visit (INDEPENDENT_AMBULATORY_CARE_PROVIDER_SITE_OTHER): Payer: Medicare Other

## 2016-10-04 DIAGNOSIS — Z23 Encounter for immunization: Secondary | ICD-10-CM

## 2016-10-22 ENCOUNTER — Other Ambulatory Visit: Payer: Self-pay | Admitting: Internal Medicine

## 2016-11-17 ENCOUNTER — Encounter: Payer: Self-pay | Admitting: Internal Medicine

## 2016-11-26 ENCOUNTER — Other Ambulatory Visit (INDEPENDENT_AMBULATORY_CARE_PROVIDER_SITE_OTHER): Payer: Medicare Other

## 2016-11-26 ENCOUNTER — Ambulatory Visit (INDEPENDENT_AMBULATORY_CARE_PROVIDER_SITE_OTHER): Payer: Medicare Other | Admitting: Internal Medicine

## 2016-11-26 ENCOUNTER — Encounter: Payer: Self-pay | Admitting: Internal Medicine

## 2016-11-26 VITALS — BP 110/68 | HR 54 | Temp 98.2°F | Ht 69.0 in | Wt 206.0 lb

## 2016-11-26 DIAGNOSIS — Z0000 Encounter for general adult medical examination without abnormal findings: Secondary | ICD-10-CM | POA: Diagnosis not present

## 2016-11-26 DIAGNOSIS — Z23 Encounter for immunization: Secondary | ICD-10-CM

## 2016-11-26 LAB — CBC WITH DIFFERENTIAL/PLATELET
BASOS PCT: 0.8 % (ref 0.0–3.0)
Basophils Absolute: 0 10*3/uL (ref 0.0–0.1)
EOS PCT: 3.1 % (ref 0.0–5.0)
Eosinophils Absolute: 0.2 10*3/uL (ref 0.0–0.7)
HCT: 47.6 % (ref 39.0–52.0)
HEMOGLOBIN: 16 g/dL (ref 13.0–17.0)
Lymphocytes Relative: 29.7 % (ref 12.0–46.0)
Lymphs Abs: 1.7 10*3/uL (ref 0.7–4.0)
MCHC: 33.5 g/dL (ref 30.0–36.0)
MCV: 95.2 fl (ref 78.0–100.0)
MONO ABS: 0.6 10*3/uL (ref 0.1–1.0)
Monocytes Relative: 9.8 % (ref 3.0–12.0)
NEUTROS ABS: 3.3 10*3/uL (ref 1.4–7.7)
Neutrophils Relative %: 56.6 % (ref 43.0–77.0)
PLATELETS: 209 10*3/uL (ref 150.0–400.0)
RBC: 5 Mil/uL (ref 4.22–5.81)
RDW: 13.3 % (ref 11.5–15.5)
WBC: 5.9 10*3/uL (ref 4.0–10.5)

## 2016-11-26 LAB — BASIC METABOLIC PANEL
BUN: 25 mg/dL — AB (ref 6–23)
CHLORIDE: 99 meq/L (ref 96–112)
CO2: 34 meq/L — AB (ref 19–32)
Calcium: 10.4 mg/dL (ref 8.4–10.5)
Creatinine, Ser: 1.21 mg/dL (ref 0.40–1.50)
GFR: 61.43 mL/min (ref >60.00)
GLUCOSE: 97 mg/dL (ref 70–99)
Potassium: 4 mEq/L (ref 3.5–5.1)
Sodium: 143 mEq/L (ref 135–145)

## 2016-11-26 LAB — URINALYSIS
Bilirubin Urine: NEGATIVE
Hgb urine dipstick: NEGATIVE
KETONES UR: NEGATIVE
LEUKOCYTES UA: NEGATIVE
NITRITE: NEGATIVE
Specific Gravity, Urine: 1.015 (ref 1.000–1.030)
Total Protein, Urine: NEGATIVE
Urine Glucose: NEGATIVE
Urobilinogen, UA: 0.2 (ref 0.0–1.0)
pH: 7 (ref 5.0–8.0)

## 2016-11-26 LAB — LIPID PANEL
CHOLESTEROL: 206 mg/dL — AB (ref 0–200)
HDL: 65.3 mg/dL (ref >39.00)
LDL CALC: 124 mg/dL — AB (ref 0–99)
NonHDL: 140.41
TRIGLYCERIDES: 84 mg/dL (ref 0.0–149.0)
Total CHOL/HDL Ratio: 3
VLDL: 16.8 mg/dL (ref 0.0–40.0)

## 2016-11-26 LAB — HEPATIC FUNCTION PANEL
ALT: 23 U/L (ref 0–53)
AST: 28 U/L (ref 0–37)
Albumin: 4.4 g/dL (ref 3.5–5.2)
Alkaline Phosphatase: 54 U/L (ref 39–117)
Bilirubin, Direct: 0.2 mg/dL (ref 0.0–0.3)
TOTAL PROTEIN: 7.1 g/dL (ref 6.0–8.3)
Total Bilirubin: 0.8 mg/dL (ref 0.2–1.2)

## 2016-11-26 LAB — TSH: TSH: 2.43 u[IU]/mL (ref 0.35–4.50)

## 2016-11-26 LAB — PSA: PSA: 1.79 ng/mL (ref 0.10–4.00)

## 2016-11-26 MED ORDER — POTASSIUM CHLORIDE CRYS ER 20 MEQ PO TBCR: 20.0000 meq | EXTENDED_RELEASE_TABLET | Freq: Every day | ORAL | 3 refills | Status: DC

## 2016-11-26 MED ORDER — ZOSTER VAC RECOMB ADJUVANTED 50 MCG/0.5ML IM SUSR: 0.5000 mL | Freq: Once | INTRAMUSCULAR | 1 refills | Status: AC

## 2016-11-26 MED ORDER — DIAZEPAM 5 MG PO TABS: 5.0000 mg | ORAL_TABLET | Freq: Every evening | ORAL | 2 refills | Status: DC | PRN

## 2016-11-26 MED ORDER — HYDROCHLOROTHIAZIDE 25 MG PO TABS: 25.0000 mg | ORAL_TABLET | Freq: Every day | ORAL | 1 refills | Status: DC

## 2016-11-26 NOTE — Progress Notes (Addendum)
Subjective:  Patient ID: [name], male    DOB: [birth year]  Age: 81 y.o. MRN: [id number]  CC: No chief complaint on file.   HPI [name] presents for a well exam  Outpatient Medications Prior to Visit  Medication Sig Dispense Refill  . aspirin 81 MG tablet Take 81 mg by mouth daily.      . diazepam (VALIUM) 5 MG tablet TAKE 1 TABLET AT BEDTIME AS NEEDED. 30 tablet 2  . diphenhydrAMINE (BENADRYL) 25 MG tablet Take 1 tablet by mouth 3 (three) times daily as needed. 2 by mouth at bedtime     . hydrochlorothiazide (HYDRODIURIL) 25 MG tablet TAKE 1 TABLET DAILY. 100 tablet 1  . Multiple Vitamin (MULTIVITAMIN) capsule Take 2 capsules by mouth daily.     . Omega-3 1000 MG CAPS Take 1 capsule by mouth daily.    . potassium chloride SA (K-DUR,KLOR-CON) 20 MEQ tablet Take 1 tablet (20 mEq total) by mouth daily. Annual appt due in August must see provider for future refills 30 tablet 0  . vitamin C (ASCORBIC ACID) 500 MG tablet Take 500 mg by mouth daily.    Marland Kitchen saccharomyces boulardii (FLORASTOR) 250 MG capsule Take 1 capsule (250 mg total) by mouth 2 (two) times daily. 20 capsule 0   No facility-administered medications prior to visit.     ROS Review of Systems  Constitutional: Negative for appetite change, fatigue and unexpected weight change.  HENT: Negative for congestion, nosebleeds, sneezing, sore throat and trouble swallowing.   Eyes: Negative for itching and visual disturbance.  Respiratory: Negative for cough.   Cardiovascular: Negative for chest pain, palpitations and leg swelling.  Gastrointestinal: Negative for abdominal distention, blood in stool, diarrhea and nausea.  Genitourinary: Negative for frequency and hematuria.  Musculoskeletal: Negative for back pain, gait problem, joint swelling and neck pain.  Skin: Negative for rash.  Neurological: Negative for dizziness, tremors, speech difficulty and weakness.  Psychiatric/Behavioral: Negative for agitation,  dysphoric mood and sleep disturbance. The patient is not nervous/anxious.     Objective:  BP 110/68 (BP Location: Right Arm, Patient Position: Sitting, Cuff Size: Large)   Pulse (!) 54   Temp 98.2 F (36.8 C) (Oral)   Ht 5\' 9"  (1.753 m)   Wt 206 lb (93.4 kg)   SpO2 99%   BMI 30.42 kg/m   BP Readings from Last 3 Encounters:  11/26/16 110/68  12/29/15 127/74  12/27/15 118/70    Wt Readings from Last 3 Encounters:  11/26/16 206 lb (93.4 kg)  12/29/15 202 lb 2.6 oz (91.7 kg)  12/27/15 208 lb (94.3 kg)    Physical Exam  Constitutional: He is oriented to person, place, and time. He appears well-developed and well-nourished. No distress.  HENT:  Head: Normocephalic and atraumatic.  Right Ear: External ear normal.  Left Ear: External ear normal.  Nose: Nose normal.  Mouth/Throat: Oropharynx is clear and moist. No oropharyngeal exudate.  Eyes: Pupils are equal, round, and reactive to light. Conjunctivae and EOM are normal. Right eye exhibits no discharge. Left eye exhibits no discharge. No scleral icterus.  Neck: Normal range of motion. Neck supple. No JVD present. No tracheal deviation present. No thyromegaly present.  Cardiovascular: Normal rate, regular rhythm, normal heart sounds and intact distal pulses.  Exam reveals no gallop and no friction rub.   No murmur heard. Pulmonary/Chest: Effort normal and breath sounds normal. No stridor. No respiratory distress. He has no wheezes. He has no rales. He exhibits  no tenderness.  Abdominal: Soft. Bowel sounds are normal. He exhibits no distension and no mass. There is no tenderness. There is no rebound and no guarding.  Genitourinary: Rectum normal, prostate normal and penis normal. Rectal exam shows guaiac negative stool. No penile tenderness.  Musculoskeletal: Normal range of motion. He exhibits no edema or tenderness.  Lymphadenopathy:    He has no cervical adenopathy.  Neurological: He is alert and oriented to person, place, and  time. He has normal reflexes. No cranial nerve deficit. He exhibits normal muscle tone. Coordination normal.  Skin: Skin is warm and dry. No rash noted. He is not diaphoretic. No erythema. No pallor.  Psychiatric: He has a normal mood and affect. His behavior is normal. Judgment and thought content normal.    Lab Results  Component Value Date   WBC 11.7 (H) 12/28/2015   HGB 13.1 12/28/2015   HCT 39.2 12/28/2015   PLT 168 12/28/2015   GLUCOSE 113 (H) 12/28/2015   CHOL 185 11/04/2015   TRIG 97.0 11/04/2015   HDL 62.30 11/04/2015   LDLDIRECT 119.5 09/24/2006   LDLCALC 103 (H) 11/04/2015   ALT 19 12/27/2015   AST 26 12/27/2015   NA 140 12/28/2015   K 3.2 (L) 12/28/2015   CL 103 12/28/2015   CREATININE 1.29 (H) 12/28/2015   BUN 20 12/28/2015   CO2 28 12/28/2015   TSH 2.49 11/04/2015   PSA 1.51 11/04/2015    Dg Chest 2 View  Result Date: 12/27/2015 CLINICAL DATA:  Fever, chills. EXAM: CHEST  2 VIEW COMPARISON:  Radiographs of March 07, 2009. FINDINGS: The heart size and mediastinal contours are within normal limits. Both lungs are clear. No pneumothorax or pleural effusion is noted. Multilevel degenerative disc disease is noted in the thoracic spine. IMPRESSION: No active cardiopulmonary disease. Electronically Signed   By: Marijo Conception, M.D.   On: 12/27/2015 13:37   Ct Renal Stone Study  Result Date: 12/28/2015 CLINICAL DATA:  Sepsis due to urinary tract infection, fever, on antibiotics for 2 days. EXAM: CT ABDOMEN AND PELVIS WITHOUT CONTRAST TECHNIQUE: Multidetector CT imaging of the abdomen and pelvis was performed following the standard protocol without IV contrast. COMPARISON:  None. FINDINGS: Lower chest: No acute abnormality. Hepatobiliary: Gallbladder walls are indistinct, likely thickened. Suspect cholelithiasis. Small focus of air in the gallbladder may be trapped within a gallstone. No bile duct dilatation seen. Liver appears within normal limits for a noncontrast study.  Pancreas: Unremarkable. No pancreatic ductal dilatation or surrounding inflammatory changes. Spleen: Normal in size without focal abnormality. Adrenals/Urinary Tract: Adrenal glands appear normal. Hypodense masses within each kidney, largest measuring 4 cm located within the left kidney posteriorly is consistent with a simple cyst by CT density criteria. No renal stone or hydronephrosis. No perinephric fluid. No ureteral or bladder calculi identified. Bladder is unremarkable, partially decompressed. Stomach/Bowel: Bowel is normal in caliber. No bowel wall thickening or evidence of bowel wall inflammation seen. Appendix is normal. Stomach appears normal. Vascular/Lymphatic: Scattered atherosclerotic changes of the normal caliber abdominal aorta. No enlarged lymph nodes seen in the abdomen or pelvis. Reproductive: Prostate gland is enlarged causing slight mass effect on the bladder base. Otherwise unremarkable. Other: No free fluid or abscess collection seen. No free intraperitoneal air. Musculoskeletal: Degenerative changes throughout the thoracolumbar spine, at least moderate in degree at the L2 through S1 levels. Associated disc-osteophytic bulge at the L3-4 level causing a focal moderate-to-severe central canal stenosis. Patient is status post a left hemilaminectomy at this level.  Posterior pedicle fusion hardware at the lumbosacral junction appears intact and appropriately aligned. No acute or suspicious osseous finding. Superficial soft tissues are unremarkable. IMPRESSION: 1. Gallbladder walls are indistinct and likely thickened suggesting acute cholecystitis. Probable cholelithiasis. Single focus of air within the gallbladder is most likely trapped within a gallstone (chronic), however, developing emphysematous cholecystitis cannot be definitively excluded based on this exam. Recommend further characterization with right upper quadrant ultrasound. 2. Renal cysts. No renal stone or hydronephrosis. No evidence  of pyelonephritis seen although characterization is limited by the lack of IV contrast. Bladder is decompressed 3. Prostate enlargement. 4. Degenerative and surgical changes of the lumbar spine, as detailed above. 5. Aortic atherosclerosis. These results were called by telephone at the time of interpretation on 12/28/2015 at 2:51 pm to Dr. Phillips Climes , who verbally acknowledged these results. Electronically Signed   By: Franki Cabot M.D.   On: 12/28/2015 14:54   US Abdomen Limited Ruq  Result Date: 12/28/2015 CLINICAL DATA:  History of gallstones.  Fever.  No pain. EXAM: US ABDOMEN LIMITED - RIGHT UPPER QUADRANT COMPARISON:  CT, 12/28/2015 at 1420 hours FINDINGS: Gallbladder: Multiple shadowing gallstones. Gallbladder wall is borderline thickened at 3.7 mm. No pericholecystic fluid. No sonographic Murphy's sign. Common bile duct: Diameter: 4.2 mm Liver: No focal lesion identified. Within normal limits in parenchymal echogenicity. IMPRESSION: 1. Multiple gallstones.  Note evidence of acute cholecystitis. 2. No bile duct dilation.  Normal appearance of the liver. Electronically Signed   By: Lajean Manes M.D.   On: 12/28/2015 21:44    Assessment & Plan:   There are no diagnoses linked to this encounter. I have discontinued [name]'s saccharomyces boulardii. I am also having him maintain his aspirin, diphenhydrAMINE, multivitamin, vitamin C, Omega-3, hydrochlorothiazide, diazepam, and potassium chloride SA.  No orders of the defined types were placed in this encounter.    Follow-up: No Follow-up on file.  Walker Kehr, MD

## 2016-11-26 NOTE — Assessment & Plan Note (Signed)
Here for medicare wellness/physical  Diet: heart healthy  Physical activity: not sedentary - swimming, weights Depression/mood screen: negative  Hearing: intact to whispered voice  Visual acuity: grossly normal, performs annual eye exam  ADLs: capable  Fall risk: low to none  Home safety: good  Cognitive evaluation: intact to orientation, naming, recall and repetition  EOL planning: adv directives, full code/ I agree  I have personally reviewed and have noted  1. The patient's medical, surgical and social history  2. Their use of alcohol, tobacco or illicit drugs  3. Their current medications and supplements  4. The patient's functional ability including ADL's, fall risks, home safety risks and hearing or visual impairment.  5. Diet and physical activities  6. Evidence for depression or mood disorders 7. The roster of all physicians providing medical care to patient - is listed in the Snapshot section of the chart and reviewed today.    Today patient counseled on age appropriate routine health concerns for screening and prevention, each reviewed and up to date or declined. Immunizations reviewed and up to date or declined. Labs ordered and reviewed. Risk factors for depression reviewed and negative. Hearing function and visual acuity are intact. ADLs screened and addressed as needed. Functional ability and level of safety reviewed and appropriate. Education, counseling and referrals performed based on assessed risks today. Patient provided with a copy of personalized plan for preventive services.

## 2016-11-28 DIAGNOSIS — Z0000 Encounter for general adult medical examination without abnormal findings: Secondary | ICD-10-CM | POA: Diagnosis not present

## 2016-11-28 DIAGNOSIS — Z23 Encounter for immunization: Secondary | ICD-10-CM | POA: Diagnosis not present

## 2016-11-28 NOTE — Addendum Note (Signed)
Addended by: Karren Cobble on: 11/28/2016 04:38 PM   Modules accepted: Orders

## 2017-01-31 DIAGNOSIS — L814 Other melanin hyperpigmentation: Secondary | ICD-10-CM | POA: Diagnosis not present

## 2017-01-31 DIAGNOSIS — D225 Melanocytic nevi of trunk: Secondary | ICD-10-CM | POA: Diagnosis not present

## 2017-01-31 DIAGNOSIS — Z85828 Personal history of other malignant neoplasm of skin: Secondary | ICD-10-CM | POA: Diagnosis not present

## 2017-01-31 DIAGNOSIS — L821 Other seborrheic keratosis: Secondary | ICD-10-CM | POA: Diagnosis not present

## 2017-02-05 DIAGNOSIS — H5212 Myopia, left eye: Secondary | ICD-10-CM | POA: Diagnosis not present

## 2017-02-28 ENCOUNTER — Other Ambulatory Visit: Payer: Self-pay | Admitting: Internal Medicine

## 2017-06-17 ENCOUNTER — Encounter: Payer: Self-pay | Admitting: Internal Medicine

## 2017-06-23 ENCOUNTER — Other Ambulatory Visit: Payer: Self-pay | Admitting: Internal Medicine

## 2017-06-24 ENCOUNTER — Other Ambulatory Visit: Payer: Self-pay | Admitting: Internal Medicine

## 2017-09-20 ENCOUNTER — Ambulatory Visit
Admission: RE | Admit: 2017-09-20 | Discharge: 2017-09-20 | Disposition: A | Discharge status: Home / Self Care | Payer: Medicare Other | Source: Ambulatory Visit | Attending: Physician Assistant | Admitting: Physician Assistant

## 2017-09-20 ENCOUNTER — Other Ambulatory Visit (INDEPENDENT_AMBULATORY_CARE_PROVIDER_SITE_OTHER): Payer: Self-pay | Admitting: Physician Assistant

## 2017-09-20 DIAGNOSIS — M19012 Primary osteoarthritis, left shoulder: Secondary | ICD-10-CM | POA: Diagnosis not present

## 2017-09-20 DIAGNOSIS — R52 Pain, unspecified: Secondary | ICD-10-CM

## 2017-09-20 DIAGNOSIS — M19011 Primary osteoarthritis, right shoulder: Secondary | ICD-10-CM | POA: Diagnosis not present

## 2017-11-15 ENCOUNTER — Other Ambulatory Visit: Payer: Self-pay | Admitting: Internal Medicine

## 2017-11-16 ENCOUNTER — Other Ambulatory Visit: Payer: Self-pay | Admitting: Internal Medicine

## 2017-11-26 ENCOUNTER — Encounter: Payer: Medicare Other | Admitting: Internal Medicine

## 2017-11-27 ENCOUNTER — Encounter: Payer: Self-pay | Admitting: Internal Medicine

## 2017-11-27 ENCOUNTER — Ambulatory Visit (INDEPENDENT_AMBULATORY_CARE_PROVIDER_SITE_OTHER): Payer: Medicare Other | Admitting: Internal Medicine

## 2017-11-27 VITALS — BP 126/68 | HR 70 | Temp 98.0°F | Ht 69.0 in | Wt 195.0 lb

## 2017-11-27 DIAGNOSIS — N320 Bladder-neck obstruction: Secondary | ICD-10-CM

## 2017-11-27 DIAGNOSIS — Z0000 Encounter for general adult medical examination without abnormal findings: Secondary | ICD-10-CM

## 2017-11-27 DIAGNOSIS — L729 Follicular cyst of the skin and subcutaneous tissue, unspecified: Secondary | ICD-10-CM | POA: Diagnosis not present

## 2017-11-27 DIAGNOSIS — I10 Essential (primary) hypertension: Secondary | ICD-10-CM

## 2017-11-27 DIAGNOSIS — E785 Hyperlipidemia, unspecified: Secondary | ICD-10-CM

## 2017-11-27 MED ORDER — ZOSTER VAC RECOMB ADJUVANTED 50 MCG/0.5ML IM SUSR: 0.5000 mL | Freq: Once | INTRAMUSCULAR | 1 refills | Status: AC

## 2017-11-27 NOTE — Assessment & Plan Note (Signed)
X ray skull

## 2017-11-27 NOTE — Progress Notes (Signed)
Subjective:  Patient ID: [name], male    DOB: 10/20/1935  Age: 82 y.o. MRN: [id number]  CC: No chief complaint on file.   HPI [name] presents for HTN, anxiety f/u. C/o a growth on the forehead x 6 mo Well exam  Outpatient Medications Prior to Visit  Medication Sig Dispense Refill  . aspirin 81 MG tablet Take 81 mg by mouth daily.      . diazepam (VALIUM) 5 MG tablet TAKE 1 TABLET AT BEDTIME AS NEEDED. 30 tablet 3  . diphenhydrAMINE (BENADRYL) 25 MG tablet Take 1 tablet by mouth 3 (three) times daily as needed. 2 by mouth at bedtime     . hydrochlorothiazide (HYDRODIURIL) 25 MG tablet TAKE 1 TABLET BY MOUTH DAILY. 100 tablet 3  . Multiple Vitamin (MULTIVITAMIN) capsule Take 2 capsules by mouth daily.     . Omega-3 1000 MG CAPS Take 1 capsule by mouth daily.    . potassium chloride SA (K-DUR,KLOR-CON) 20 MEQ tablet TAKE 1 TABLET ONCE DAILY. 90 tablet 3  . vitamin C (ASCORBIC ACID) 500 MG tablet Take 500 mg by mouth daily.     No facility-administered medications prior to visit.     ROS: Review of Systems  Constitutional: Negative for appetite change, fatigue and unexpected weight change.  HENT: Negative for congestion, nosebleeds, sneezing, sore throat and trouble swallowing.   Eyes: Negative for itching and visual disturbance.  Respiratory: Negative for cough.   Cardiovascular: Negative for chest pain, palpitations and leg swelling.  Gastrointestinal: Negative for abdominal distention, blood in stool, diarrhea and nausea.  Genitourinary: Negative for frequency and hematuria.  Musculoskeletal: Negative for back pain, gait problem, joint swelling and neck pain.  Skin: Negative for rash.  Neurological: Negative for dizziness, tremors, speech difficulty and weakness.  Psychiatric/Behavioral: Negative for agitation, dysphoric mood and sleep disturbance. The patient is not nervous/anxious.     Objective:  BP 126/68 (BP Location: Left Arm, Patient Position:  Sitting, Cuff Size: Normal)   Pulse 70   Temp 98 F (36.7 C) (Oral)   Ht 5\' 9"  (1.753 m)   Wt 195 lb (88.5 kg)   SpO2 97%   BMI 28.80 kg/m   BP Readings from Last 3 Encounters:  11/27/17 126/68  11/26/16 110/68  12/29/15 127/74    Wt Readings from Last 3 Encounters:  11/27/17 195 lb (88.5 kg)  11/26/16 206 lb (93.4 kg)  12/29/15 202 lb 2.6 oz (91.7 kg)    Physical Exam  Constitutional: He is oriented to person, place, and time. He appears well-developed. No distress.  NAD  HENT:  Mouth/Throat: Oropharynx is clear and moist.  Eyes: Pupils are equal, round, and reactive to light. Conjunctivae are normal.  Neck: Normal range of motion. No JVD present. No thyromegaly present.  Cardiovascular: Normal rate, regular rhythm, normal heart sounds and intact distal pulses. Exam reveals no gallop and no friction rub.  No murmur heard. Pulmonary/Chest: Effort normal and breath sounds normal. No respiratory distress. He has no wheezes. He has no rales. He exhibits no tenderness.  Abdominal: Soft. Bowel sounds are normal. He exhibits no distension and no mass. There is no tenderness. There is no rebound and no guarding.  Genitourinary: Rectum normal. Rectal exam shows guaiac negative stool.  Musculoskeletal: Normal range of motion. He exhibits no edema or tenderness.  Lymphadenopathy:    He has no cervical adenopathy.  Neurological: He is alert and oriented to person, place, and time. He has normal reflexes.  No cranial nerve deficit. He exhibits normal muscle tone. He displays a negative Romberg sign. Coordination and gait normal.  Skin: Skin is warm and dry. No rash noted.  Psychiatric: He has a normal mood and affect. His behavior is normal. Judgment and thought content normal.  prostate 1+ Oval 6 mm hard lesion on scull, firm, NT.   Lab Results  Component Value Date   WBC 5.9 11/26/2016   HGB 16.0 11/26/2016   HCT 47.6 11/26/2016   PLT 209.0 11/26/2016   GLUCOSE 97 11/26/2016    CHOL 206 (H) 11/26/2016   TRIG 84.0 11/26/2016   HDL 65.30 11/26/2016   LDLDIRECT 119.5 09/24/2006   LDLCALC 124 (H) 11/26/2016   ALT 23 11/26/2016   AST 28 11/26/2016   NA 143 11/26/2016   K 4.0 11/26/2016   CL 99 11/26/2016   CREATININE 1.21 11/26/2016   BUN 25 (H) 11/26/2016   CO2 34 (H) 11/26/2016   TSH 2.43 11/26/2016   PSA 1.79 11/26/2016    Dg Shoulder Right  Result Date: 09/20/2017 CLINICAL DATA:  BILATERAL shoulder pain for 2 years RIGHT greater than LEFT EXAM: RIGHT SHOULDER - 2+ VIEW COMPARISON:  None FINDINGS: Osseous mineralization normal. AC joint alignment normal. Glenohumeral joint space narrowing and minimal spur formation. No acute fracture, dislocation, or bone destruction. Visualized RIGHT ribs intact. IMPRESSION: RIGHT glenohumeral degenerative changes. No acute abnormalities. Electronically Signed   By: Lavonia Dana M.D.   On: 09/20/2017 12:03   Dg Shoulder Left  Result Date: 09/20/2017 CLINICAL DATA:  Chronic bilateral shoulder pain. EXAM: LEFT SHOULDER - 2+ VIEW COMPARISON:  Chest x-ray 12/27/2015. FINDINGS: Acromioclavicular and glenohumeral degenerative change. No evidence of fracture dislocation. No evidence of separation. Prior cervicothoracic spine fusion. IMPRESSION: Acromioclavicular glenohumeral degenerative changes left shoulder. No acute abnormality. Electronically Signed   By: Marcello Moores  Register   On: 09/20/2017 12:03    Assessment & Plan:   There are no diagnoses linked to this encounter.   No orders of the defined types were placed in this encounter.    Follow-up: No follow-ups on file.  Walker Kehr, MD

## 2017-11-27 NOTE — Assessment & Plan Note (Signed)
[facility]CT[facility] 

## 2017-11-27 NOTE — Assessment & Plan Note (Addendum)
  Here for medicare wellness/physical  Diet: heart healthy  Physical activity: not sedentary - very active Depression/mood screen: negative  Hearing: intact to whispered voice  Visual acuity: grossly normal, performs annual eye exam  ADLs: capable  Fall risk: none  Home safety: good  Cognitive evaluation: intact to orientation, naming, recall and repetition  EOL planning: adv directives, full code/ I agree  I have personally reviewed and have noted  1. The patient's medical and social history  2. Their use of alcohol, tobacco or illicit drugs  3. Their current medications and supplements  4. The patient's functional ability including ADL's, fall risks, home safety risks and hearing or visual impairment.  5. Diet and physical activities  6. Evidence for depression or mood disorders    Today patient counseled on age appropriate routine health concerns for screening and prevention, each reviewed and up to date or declined. Immunizations reviewed and up to date or declined. Labs ordered and reviewed. Risk factors for depression reviewed and negative. Hearing function and visual acuity are intact. ADLs screened and addressed as needed. Functional ability and level of safety reviewed and appropriate. Education, counseling and referrals performed based on assessed risks today. Patient provided with a copy of personalized plan for preventive services.  Cardiac CT calcium scoring is suggested Shingrix rx

## 2017-11-27 NOTE — Patient Instructions (Signed)
Cardiac CT calcium scoring

## 2017-11-28 ENCOUNTER — Telehealth: Payer: Self-pay | Admitting: Internal Medicine

## 2017-11-28 ENCOUNTER — Other Ambulatory Visit (INDEPENDENT_AMBULATORY_CARE_PROVIDER_SITE_OTHER): Payer: Medicare Other

## 2017-11-28 ENCOUNTER — Ambulatory Visit (INDEPENDENT_AMBULATORY_CARE_PROVIDER_SITE_OTHER)
Admission: RE | Admit: 2017-11-28 | Discharge: 2017-11-28 | Disposition: A | Discharge status: Home / Self Care | Payer: Medicare Other | Source: Ambulatory Visit | Attending: Internal Medicine | Admitting: Internal Medicine

## 2017-11-28 DIAGNOSIS — N320 Bladder-neck obstruction: Secondary | ICD-10-CM

## 2017-11-28 DIAGNOSIS — R239 Unspecified skin changes: Secondary | ICD-10-CM | POA: Diagnosis not present

## 2017-11-28 DIAGNOSIS — E785 Hyperlipidemia, unspecified: Secondary | ICD-10-CM | POA: Diagnosis not present

## 2017-11-28 DIAGNOSIS — L729 Follicular cyst of the skin and subcutaneous tissue, unspecified: Secondary | ICD-10-CM | POA: Diagnosis not present

## 2017-11-28 DIAGNOSIS — Z0000 Encounter for general adult medical examination without abnormal findings: Secondary | ICD-10-CM | POA: Diagnosis not present

## 2017-11-28 LAB — CBC WITH DIFFERENTIAL/PLATELET
Basophils Absolute: 0 10*3/uL (ref 0.0–0.1)
Basophils Relative: 0.6 % (ref 0.0–3.0)
EOS ABS: 0.2 10*3/uL (ref 0.0–0.7)
Eosinophils Relative: 3.4 % (ref 0.0–5.0)
HCT: 43.9 % (ref 39.0–52.0)
HEMOGLOBIN: 15 g/dL (ref 13.0–17.0)
Lymphocytes Relative: 35.3 % (ref 12.0–46.0)
Lymphs Abs: 2.2 10*3/uL (ref 0.7–4.0)
MCHC: 34.3 g/dL (ref 30.0–36.0)
MCV: 93.2 fl (ref 78.0–100.0)
MONO ABS: 0.6 10*3/uL (ref 0.1–1.0)
Monocytes Relative: 9.5 % (ref 3.0–12.0)
Neutro Abs: 3.2 10*3/uL (ref 1.4–7.7)
Neutrophils Relative %: 51.2 % (ref 43.0–77.0)
Platelets: 207 10*3/uL (ref 150.0–400.0)
RBC: 4.7 Mil/uL (ref 4.22–5.81)
RDW: 13.2 % (ref 11.5–15.5)
WBC: 6.3 10*3/uL (ref 4.0–10.5)

## 2017-11-28 LAB — BASIC METABOLIC PANEL
BUN: 33 mg/dL — AB (ref 6–23)
CHLORIDE: 97 meq/L (ref 96–112)
CO2: 36 meq/L — AB (ref 19–32)
CREATININE: 1.33 mg/dL (ref 0.40–1.50)
Calcium: 9.8 mg/dL (ref 8.4–10.5)
GFR: 54.94 mL/min — ABNORMAL LOW (ref >60.00)
GLUCOSE: 108 mg/dL — AB (ref 70–99)
Potassium: 3.1 mEq/L — ABNORMAL LOW (ref 3.5–5.1)
Sodium: 142 mEq/L (ref 135–145)

## 2017-11-28 LAB — URINALYSIS
BILIRUBIN URINE: NEGATIVE
KETONES UR: NEGATIVE
Leukocytes, UA: NEGATIVE
NITRITE: NEGATIVE
Specific Gravity, Urine: 1.025 (ref 1.000–1.030)
TOTAL PROTEIN, URINE-UPE24: NEGATIVE
Urine Glucose: NEGATIVE
Urobilinogen, UA: 0.2 (ref 0.0–1.0)
pH: 6 (ref 5.0–8.0)

## 2017-11-28 LAB — HEPATIC FUNCTION PANEL
ALT: 19 U/L (ref 0–53)
AST: 28 U/L (ref 0–37)
Albumin: 4.1 g/dL (ref 3.5–5.2)
Alkaline Phosphatase: 61 U/L (ref 39–117)
BILIRUBIN DIRECT: 0.2 mg/dL (ref 0.0–0.3)
Total Bilirubin: 0.9 mg/dL (ref 0.2–1.2)
Total Protein: 6.8 g/dL (ref 6.0–8.3)

## 2017-11-28 LAB — TSH: TSH: 2.95 u[IU]/mL (ref 0.35–4.50)

## 2017-11-28 LAB — PSA: PSA: 1.46 ng/mL (ref 0.10–4.00)

## 2017-11-28 LAB — LIPID PANEL
Cholesterol: 181 mg/dL (ref 0–200)
HDL: 67.1 mg/dL (ref >39.00)
LDL CALC: 102 mg/dL — AB (ref 0–99)
NONHDL: 113.85
Total CHOL/HDL Ratio: 3
Triglycerides: 61 mg/dL (ref 0.0–149.0)
VLDL: 12.2 mg/dL (ref 0.0–40.0)

## 2017-11-28 NOTE — Telephone Encounter (Signed)
Copied from [place] [id number]. Topic: General - Other >> Nov 28, 2017  2:16 PM Cecelia Byars, NT wrote: Reason for CRM: Patient called and said he had a xray done this morning and he would like the results as soon as possible [phone]

## 2017-12-02 ENCOUNTER — Other Ambulatory Visit: Payer: Self-pay | Admitting: Internal Medicine

## 2017-12-02 MED ORDER — LOSARTAN POTASSIUM 50 MG PO TABS: 50.0000 mg | ORAL_TABLET | Freq: Every day | ORAL | 3 refills | Status: DC

## 2017-12-04 NOTE — Telephone Encounter (Signed)
Pt given results  

## 2017-12-24 ENCOUNTER — Other Ambulatory Visit: Payer: Self-pay | Admitting: Internal Medicine

## 2017-12-24 DIAGNOSIS — R29890 Loss of height: Secondary | ICD-10-CM

## 2017-12-30 ENCOUNTER — Ambulatory Visit (INDEPENDENT_AMBULATORY_CARE_PROVIDER_SITE_OTHER): Payer: Medicare Other

## 2017-12-30 DIAGNOSIS — Z23 Encounter for immunization: Secondary | ICD-10-CM

## 2018-01-29 ENCOUNTER — Ambulatory Visit (INDEPENDENT_AMBULATORY_CARE_PROVIDER_SITE_OTHER)
Admission: RE | Admit: 2018-01-29 | Discharge: 2018-01-29 | Disposition: A | Discharge status: Home / Self Care | Payer: Medicare Other | Source: Ambulatory Visit | Attending: Internal Medicine | Admitting: Internal Medicine

## 2018-01-29 DIAGNOSIS — R29890 Loss of height: Secondary | ICD-10-CM | POA: Diagnosis not present

## 2018-01-29 DIAGNOSIS — Z13820 Encounter for screening for osteoporosis: Secondary | ICD-10-CM | POA: Diagnosis not present

## 2018-02-04 ENCOUNTER — Ambulatory Visit: Payer: Medicare Other | Admitting: Internal Medicine

## 2018-03-10 ENCOUNTER — Encounter: Payer: Self-pay | Admitting: Internal Medicine

## 2018-03-10 ENCOUNTER — Ambulatory Visit (INDEPENDENT_AMBULATORY_CARE_PROVIDER_SITE_OTHER)
Admission: RE | Admit: 2018-03-10 | Discharge: 2018-03-10 | Disposition: A | Discharge status: Home / Self Care | Payer: Medicare Other | Source: Ambulatory Visit | Attending: Internal Medicine | Admitting: Internal Medicine

## 2018-03-10 ENCOUNTER — Ambulatory Visit: Payer: Medicare Other | Admitting: Internal Medicine

## 2018-03-10 DIAGNOSIS — M545 Low back pain, unspecified: Secondary | ICD-10-CM | POA: Insufficient documentation

## 2018-03-10 DIAGNOSIS — G8929 Other chronic pain: Secondary | ICD-10-CM

## 2018-03-10 DIAGNOSIS — M5137 Other intervertebral disc degeneration, lumbosacral region: Secondary | ICD-10-CM

## 2018-03-10 DIAGNOSIS — I10 Essential (primary) hypertension: Secondary | ICD-10-CM | POA: Diagnosis not present

## 2018-03-10 MED ORDER — AZITHROMYCIN 250 MG PO TABS: ORAL_TABLET | ORAL | 0 refills | Status: DC

## 2018-03-10 NOTE — Assessment & Plan Note (Signed)
Chronic S/p LS fusion 2008 X ray

## 2018-03-10 NOTE — Patient Instructions (Signed)

## 2018-03-10 NOTE — Progress Notes (Signed)
Subjective:  Patient ID: [name], male    DOB: 08-Nov-1935  Age: 82 y.o. MRN: [id number]  CC: No chief complaint on file.   HPI [name] presents for HTN, anxiety C/o LBP  Outpatient Medications Prior to Visit  Medication Sig Dispense Refill  . diazepam (VALIUM) 5 MG tablet TAKE 1 TABLET AT BEDTIME AS NEEDED. 30 tablet 3  . diphenhydrAMINE (BENADRYL) 25 MG tablet Take 1 tablet by mouth 3 (three) times daily as needed. 2 by mouth at bedtime     . losartan (COZAAR) 50 MG tablet Take 1 tablet (50 mg total) by mouth daily. 90 tablet 3  . Magnesium 400 MG CAPS Take by mouth.    . Multiple Vitamin (MULTIVITAMIN) capsule Take 2 capsules by mouth daily.     . Omega-3 1000 MG CAPS Take 1 capsule by mouth daily.    . vitamin C (ASCORBIC ACID) 500 MG tablet Take 500 mg by mouth daily.    Marland Kitchen aspirin 81 MG tablet Take 81 mg by mouth daily.       No facility-administered medications prior to visit.     ROS: Review of Systems  Constitutional: Negative for appetite change, fatigue and unexpected weight change.  HENT: Negative for congestion, nosebleeds, sneezing, sore throat and trouble swallowing.   Eyes: Negative for itching and visual disturbance.  Respiratory: Negative for cough.   Cardiovascular: Negative for chest pain, palpitations and leg swelling.  Gastrointestinal: Negative for abdominal distention, blood in stool, diarrhea and nausea.  Genitourinary: Negative for frequency and hematuria.  Musculoskeletal: Positive for back pain. Negative for gait problem, joint swelling and neck pain.  Skin: Negative for rash.  Neurological: Negative for dizziness, tremors, speech difficulty and weakness.  Psychiatric/Behavioral: Negative for agitation, dysphoric mood and sleep disturbance. The patient is not nervous/anxious.     Objective:  BP 134/78 (BP Location: Left Arm, Patient Position: Sitting, Cuff Size: Large)   Pulse (!) 46   Temp 98.2 F (36.8 C) (Oral)   Ht 5\' 9"   (1.753 m)   Wt 199 lb (90.3 kg)   SpO2 98%   BMI 29.39 kg/m   BP Readings from Last 3 Encounters:  03/10/18 134/78  11/27/17 126/68  11/26/16 110/68    Wt Readings from Last 3 Encounters:  03/10/18 199 lb (90.3 kg)  11/27/17 195 lb (88.5 kg)  11/26/16 206 lb (93.4 kg)    Physical Exam  Constitutional: He is oriented to person, place, and time. He appears well-developed. No distress.  NAD  HENT:  Mouth/Throat: Oropharynx is clear and moist.  Eyes: Pupils are equal, round, and reactive to light. Conjunctivae are normal.  Neck: Normal range of motion. No JVD present. No thyromegaly present.  Cardiovascular: Normal rate, regular rhythm, normal heart sounds and intact distal pulses. Exam reveals no gallop and no friction rub.  No murmur heard. Pulmonary/Chest: Effort normal and breath sounds normal. No respiratory distress. He has no wheezes. He has no rales. He exhibits no tenderness.  Abdominal: Soft. Bowel sounds are normal. He exhibits no distension and no mass. There is no tenderness. There is no rebound and no guarding.  Musculoskeletal: Normal range of motion. He exhibits tenderness. He exhibits no edema.  Lymphadenopathy:    He has no cervical adenopathy.  Neurological: He is alert and oriented to person, place, and time. He has normal reflexes. No cranial nerve deficit. He exhibits normal muscle tone. He displays a negative Romberg sign. Coordination and gait normal.  Skin:  Skin is warm and dry. No rash noted.  Psychiatric: He has a normal mood and affect. His behavior is normal. Judgment and thought content normal.   LS a little tender w/ROM  Lab Results  Component Value Date   WBC 6.3 11/28/2017   HGB 15.0 11/28/2017   HCT 43.9 11/28/2017   PLT 207.0 11/28/2017   GLUCOSE 108 (H) 11/28/2017   CHOL 181 11/28/2017   TRIG 61.0 11/28/2017   HDL 67.10 11/28/2017   LDLDIRECT 119.5 09/24/2006   LDLCALC 102 (H) 11/28/2017   ALT 19 11/28/2017   AST 28 11/28/2017   NA  142 11/28/2017   K 3.1 (L) 11/28/2017   CL 97 11/28/2017   CREATININE 1.33 11/28/2017   BUN 33 (H) 11/28/2017   CO2 36 (H) 11/28/2017   TSH 2.95 11/28/2017   PSA 1.46 11/28/2017    Dg Bone Density  Result Date: 02/02/2018 Date of study: 01/29/18 Exam: DUAL X-RAY ABSORPTIOMETRY (DXA) FOR BONE MINERAL DENSITY (BMD) Instrument: [employer] Provider: PCP Indication: screening for osteoporosis Comparison: none (please note that it is not possible to compare data from different instruments) Clinical data: Pt is a 82 y.o. male without previous fractures. Results:  Lumbar spine L1-L4 Femoral neck (FN) 33% distal radius T-score 4.7 RFN: 0.2 LFN: 1.1 n/a Change in BMD from previous DXA test (%) n/a n/a n/a (*) statistically significant Assessment: the BMD is normal according to the [facility] classification for osteoporosis (see below). Fracture risk: low FRAX score: not calculated due to normal BMD Comments: the technical quality of the study is good  L2 was excluded from study.   (L4 is excluded due to hardware) Recommend optimizing calcium (1200 mg/day) and vitamin D (800 IU/day) intake. No pharmacological treatment is indicated. Followup: Repeat BMD is appropriate after 2 years. WHO criteria for diagnosis of osteoporosis in postmenopausal women and in men 20 y/o or older: - normal: T-score -1.0 to + 1.0 - osteopenia/low bone density: T-score between -2.5 and -1.0 - osteoporosis: T-score below -2.5 - severe osteoporosis: T-score below -2.5 with history of fragility fracture Note: although not part of the WHO classification, the presence of a fragility fracture, regardless of the T-score, should be considered diagnostic of osteoporosis, provided other causes for the fracture have been excluded. Loura Pardon MD    Assessment & Plan:   There are no diagnoses linked to this encounter.   No orders of the defined types were placed in this encounter.    Follow-up: No follow-ups on file.  Walker Kehr, MD

## 2018-03-10 NOTE — Assessment & Plan Note (Signed)
Chronic LBP Try yoga, tai chi

## 2018-03-10 NOTE — Assessment & Plan Note (Addendum)
LOSARTAN cardiac CT scan for calcium scoring offered

## 2018-03-16 ENCOUNTER — Other Ambulatory Visit: Payer: Self-pay | Admitting: Internal Medicine

## 2018-03-17 ENCOUNTER — Other Ambulatory Visit: Payer: Self-pay | Admitting: Internal Medicine

## 2018-03-17 DIAGNOSIS — E785 Hyperlipidemia, unspecified: Secondary | ICD-10-CM

## 2018-04-04 DIAGNOSIS — L570 Actinic keratosis: Secondary | ICD-10-CM | POA: Diagnosis not present

## 2018-04-04 DIAGNOSIS — D225 Melanocytic nevi of trunk: Secondary | ICD-10-CM | POA: Diagnosis not present

## 2018-04-04 DIAGNOSIS — L814 Other melanin hyperpigmentation: Secondary | ICD-10-CM | POA: Diagnosis not present

## 2018-04-04 DIAGNOSIS — Z85828 Personal history of other malignant neoplasm of skin: Secondary | ICD-10-CM | POA: Diagnosis not present

## 2018-04-04 DIAGNOSIS — D692 Other nonthrombocytopenic purpura: Secondary | ICD-10-CM | POA: Diagnosis not present

## 2018-04-13 ENCOUNTER — Other Ambulatory Visit: Payer: Self-pay | Admitting: Internal Medicine

## 2018-04-13 MED ORDER — DICLOFENAC SODIUM 1 % TD GEL: 4.0000 g | Freq: Four times a day (QID) | TRANSDERMAL | 3 refills | Status: DC

## 2018-04-15 ENCOUNTER — Ambulatory Visit (INDEPENDENT_AMBULATORY_CARE_PROVIDER_SITE_OTHER)
Admission: RE | Admit: 2018-04-15 | Discharge: 2018-04-15 | Disposition: A | Discharge status: Home / Self Care | Payer: Medicare Other | Source: Ambulatory Visit | Attending: Internal Medicine | Admitting: Internal Medicine

## 2018-04-15 DIAGNOSIS — E785 Hyperlipidemia, unspecified: Secondary | ICD-10-CM

## 2018-04-16 DIAGNOSIS — L570 Actinic keratosis: Secondary | ICD-10-CM | POA: Diagnosis not present

## 2018-04-16 DIAGNOSIS — Z85828 Personal history of other malignant neoplasm of skin: Secondary | ICD-10-CM | POA: Diagnosis not present

## 2018-05-26 ENCOUNTER — Encounter: Payer: Self-pay | Admitting: Nurse Practitioner

## 2018-05-26 ENCOUNTER — Ambulatory Visit: Payer: Medicare Other | Admitting: Nurse Practitioner

## 2018-05-26 ENCOUNTER — Ambulatory Visit (INDEPENDENT_AMBULATORY_CARE_PROVIDER_SITE_OTHER)
Admission: RE | Admit: 2018-05-26 | Discharge: 2018-05-26 | Disposition: A | Discharge status: Home / Self Care | Payer: Medicare Other | Source: Ambulatory Visit | Attending: Nurse Practitioner | Admitting: Nurse Practitioner

## 2018-05-26 VITALS — BP 150/90 | HR 67 | Ht 69.0 in | Wt 194.0 lb

## 2018-05-26 DIAGNOSIS — M542 Cervicalgia: Secondary | ICD-10-CM | POA: Diagnosis not present

## 2018-05-26 MED ORDER — TIZANIDINE HCL 2 MG PO CAPS: 2.0000 mg | ORAL_CAPSULE | Freq: Three times a day (TID) | ORAL | 0 refills | Status: DC

## 2018-05-26 NOTE — Progress Notes (Signed)
[name] is a 83 y.o. male with the following history as recorded in Epic[employer]:  Patient Active Problem List   Diagnosis Date Noted  . Low back pain 03/10/2018  . Scalp cyst 11/27/2017  . Febrile illness 12/27/2015  . Rigors 12/27/2015  . Sepsis ([place]) 12/27/2015  . Hypokalemia 12/27/2015  . Stevens-Johnson syndrome involving mucosae and <10% body surface area ([place]) 11/25/2015  . Bilateral carotid bruits 11/01/2015  . Nonspecific abnormal electrocardiogram (ECG) (EKG) 11/01/2015  . Well adult exam 04/13/2014  . Overweight(278.02) 02/19/2008  . DEGENERATIVE DISC DISEASE, CERVICAL SPINE 02/19/2008  . Essential hypertension 12/17/2007  . INTERNAL HEMORRHOIDS 12/17/2007  . Calculus of gallbladder 12/17/2007  . RENAL CYST 12/17/2007  . [place] DISEASE, LUMBAR 12/17/2007  . POLIOMYELITIS, HX OF 12/17/2007  . COLONIC POLYPS, ADENOMATOUS, HX OF 12/17/2007  . SHINGLES, HX OF 12/17/2007    Current Outpatient Medications  Medication Sig Dispense Refill  . diazepam (VALIUM) 5 MG tablet TAKE 1 TABLET AT BEDTIME AS NEEDED. 30 tablet 3  . diclofenac sodium (VOLTAREN) 1 % GEL Apply 4 g topically 4 (four) times daily. 200 g 3  . diphenhydrAMINE (BENADRYL) 25 MG tablet Take 1 tablet by mouth 3 (three) times daily as needed. 2 by mouth at bedtime     . losartan (COZAAR) 50 MG tablet Take 1 tablet (50 mg total) by mouth daily. 90 tablet 3  . Magnesium 400 MG CAPS Take by mouth.    . Multiple Vitamin (MULTIVITAMIN) capsule Take 2 capsules by mouth daily.     . Omega-3 1000 MG CAPS Take 1 capsule by mouth daily.    . vitamin C (ASCORBIC ACID) 500 MG tablet Take 500 mg by mouth daily.    . tizanidine (ZANAFLEX) 2 MG capsule Take 1 capsule (2 mg total) by mouth 3 (three) times daily. Take 1-2 capsules (2 to 4 mg) every 8 hours as needed for neck pain 30 capsule 0   No current facility-administered medications for this visit.     Allergies: Amoxicillin  Past Medical History:  Diagnosis Date  .  CHOLELITHIASIS 12/17/2007  . COLONIC POLYPS, ADENOMATOUS, HX OF 12/17/2007  . DEGENERATIVE DISC DISEASE, CERVICAL SPINE 02/19/2008  . [place] DISEASE, LUMBAR 12/17/2007  . HYPERTENSION 12/17/2007  . INTERNAL HEMORRHOIDS 12/17/2007  . Overweight(278.02) 02/19/2008  . POLIOMYELITIS, HX OF 12/17/2007  . RENAL CYST 12/17/2007  . SHINGLES, HX OF 12/17/2007    Past Surgical History:  Procedure Laterality Date  . Bilateral knee surgery     arthroscopy bilateral - Dr. Noemi Chapel  . CERVICAL FUSION    . HEMORRHOID SURGERY    . lumbar back surgery     two procedures: diskectomy and then a fusion L4-5, L5-S1 Dr. Sherwood Gambler  . TONSILLECTOMY      Family History  Problem Relation Age of Onset  . Cancer Neg Hx   . Diabetes Neg Hx   . Heart disease Neg Hx   . Hyperlipidemia Neg Hx     Social History   Tobacco Use  . Smoking status: Former Smoker    Last attempt to quit: 05/03/2005    Years since quitting: 13.0  . Smokeless tobacco: Never Used  Substance Use Topics  . Alcohol use: Yes    Comment: rare drinker     Subjective:  [name] is here today requesting evaluation of neck pain- which began about 3 days ago after playing in snow with grandchildren. He does not recall any injuries, no falls, has had stiff pain  in his posterior neck since playing in snow, the pain is increase with movement, head rotation. Denies fevers, chills, numbness, tingling, weakness, swelling, skin discoloration, cp or sob. He is concerned due to history of neck surgery 10 years ago, wants to make sure "nothing is loose from surgery." Tried at home- aspirin, advil, heat with minimal relief  ROS- See HPI  Objective:  Vitals:   05/26/18 0957  BP: (!) 150/90  Pulse: 67  SpO2: 96%  Weight: 194 lb (88 kg)  Height: 5\' 9"  (1.753 m)    General: Well developed, well nourished, in no acute distress  Skin : Warm and dry. No erythema, bruising. Head: Normocephalic and atraumatic  Eyes: Sclera and conjunctiva clear; pupils  round and reactive to light; extraocular movements intact  Oropharynx: Pink, supple. No suspicious lesions  Neck: Supple without thyromegaly Lungs: Respirations unlabored; clear to auscultation bilaterally without wheeze, rales, rhonchi  CVS exam: normal rate and regular rhythm, S1 and S2 normal.  Musculoskeletal: No deformities; no active joint inflammation  Musculoskeletal:     Cervical back: He exhibits normal range of motion, no tenderness, no swelling and no deformity.  Extremities: No edema, cyanosis, clubbing  Vessels: Symmetric bilaterally  Neurologic: Alert and oriented; speech intact; face symmetrical; moves all extremities well; CNII-XII intact without focal deficit  Psychiatric: Normal mood and affect.   Assessment:  1. Neck pain     Plan:   Suspect muscle strain- zanaflex prn- medication dosing/side effects discussed Home management, neck exercises, red flags and return precautions including when to seek immediate care discussed and printed on AVS He requests a neck xray, which I have ordered- F/U with further recommendations pending xray results  No follow-ups on file.  Orders Placed This Encounter  Procedures  . DG Cervical Spine Complete    Standing Status:   Future    Standing Expiration Date:   07/25/2019    Order Specific Question:   Reason for Exam (SYMPTOM  OR DIAGNOSIS REQUIRED)    Answer:   neck pain    Order Specific Question:   Preferred imaging location?    Answer:   [facility]    Order Specific Question:   Radiology Contrast Protocol - do NOT remove file path    Answer:   [email or web address]    Requested Prescriptions   Signed Prescriptions Disp Refills  . tizanidine (ZANAFLEX) 2 MG capsule 30 capsule 0    Sig: Take 1 capsule (2 mg total) by mouth 3 (three) times daily. Take 1-2 capsules (2 to 4 mg) every 8 hours as needed for neck pain

## 2018-05-26 NOTE — Patient Instructions (Addendum)
Head downstairs for neck xray  I have sent prescription for zanaflex - which can cause drowsiness. Please do not drink alcohol or operate machinery when you take this medication. Do not combine this medication with other medications that can cause drowsiness.   Neck Exercises Neck exercises can be important for many reasons:  They can help you to improve and maintain flexibility in your neck. This can be especially important as you age.  They can help to make your neck stronger. This can make movement easier.  They can reduce or prevent neck pain.  They may help your upper back. Ask your health care provider which neck exercises would be best for you. Exercises to improve neck flexibility Neck stretch Repeat this exercise 3-5 times. 1. Do this exercise while standing or while sitting in a chair. 2. Place your feet flat on the floor, shoulder-width apart. 3. Slowly turn your head to the right. Turn it all the way to the right so you can look over your right shoulder. Do not tilt or tip your head. 4. Hold this position for 10-30 seconds. 5. Slowly turn your head to the left, to look over your left shoulder. 6. Hold this position for 10-30 seconds.  Neck retraction Repeat this exercise 8-10 times. Do this 3-4 times a day or as told by your health care provider. 1. Do this exercise while standing or while sitting in a sturdy chair. 2. Look straight ahead. Do not bend your neck. 3. Use your fingers to push your chin backward. Do not bend your neck for this movement. Continue to face straight ahead. If you are doing the exercise properly, you will feel a slight sensation in your throat and a stretch at the back of your neck. 4. Hold the stretch for 1-2 seconds. Relax and repeat. Exercises to improve neck strength Neck press Repeat this exercise 10 times. Do it first thing in the morning and right before bed or as told by your health care provider. 1. Lie on your back on a firm bed or on  the floor with a pillow under your head. 2. Use your neck muscles to push your head down on the pillow and straighten your spine. 3. Hold the position as well as you can. Keep your head facing up and your chin tucked. 4. Slowly count to 5 while holding this position. 5. Relax for a few seconds. Then repeat. Isometric strengthening Do a full set of these exercises 2 times a day or as told by your health care provider. 1. Sit in a supportive chair and place your hand on your forehead. 2. Push forward with your head and neck while pushing back with your hand. Hold for 10 seconds. 3. Relax. Then repeat the exercise 3 times. 4. Next, do thesequence again, this time putting your hand against the back of your head. Use your head and neck to push backward against the hand pressure. 5. Finally, do the same exercise on either side of your head, pushing sideways against the pressure of your hand. Prone head lifts Repeat this exercise 5 times. Do this 2 times a day or as told by your health care provider. 1. Lie face-down, resting on your elbows so that your chest and upper back are raised. 2. Start with your head facing downward, near your chest. Position your chin either on or near your chest. 3. Slowly lift your head upward. Lift until you are looking straight ahead. Then continue lifting your head as far back  as you can stretch. 4. Hold your head up for 5 seconds. Then slowly lower it to your starting position. Supine head lifts Repeat this exercise 8-10 times. Do this 2 times a day or as told by your health care provider. 1. Lie on your back, bending your knees to point to the ceiling and keeping your feet flat on the floor. 2. Lift your head slowly off the floor, raising your chin toward your chest. 3. Hold for 5 seconds. 4. Relax and repeat. Scapular retraction Repeat this exercise 5 times. Do this 2 times a day or as told by your health care provider. 1. Stand with your arms at your sides.  Look straight ahead. 2. Slowly pull both shoulders backward and downward until you feel a stretch between your shoulder blades in your upper back. 3. Hold for 10-30 seconds. 4. Relax and repeat. Contact a health care provider if:  Your neck pain or discomfort gets much worse when you do an exercise.  Your neck pain or discomfort does not improve within 2 hours after you exercise. If you have any of these problems, stop exercising right away. Do not do the exercises again unless your health care provider says that you can. Get help right away if:  You develop sudden, severe neck pain. If this happens, stop exercising right away. Do not do the exercises again unless your health care provider says that you can. This information is not intended to replace advice given to you by your health care provider. Make sure you discuss any questions you have with your health care provider. Document Released: 02/28/2015 Document Revised: 07/23/2017 Document Reviewed: 09/27/2014 Elsevier Interactive Patient Education  2019 [employer].

## 2018-05-28 DIAGNOSIS — H524 Presbyopia: Secondary | ICD-10-CM | POA: Diagnosis not present

## 2018-06-13 NOTE — Telephone Encounter (Signed)
Addressed in other mychart message

## 2018-07-10 ENCOUNTER — Other Ambulatory Visit: Payer: Self-pay | Admitting: Internal Medicine

## 2018-08-17 IMAGING — US US ABDOMEN LIMITED
1 series · 14 of 25 positions shown · non-contrast
Comparison: CT, 12/28/2015 at 9502 hours

CLINICAL DATA: History of gallstones.  Fever.  No pain.

EXAM:
US ABDOMEN LIMITED - RIGHT UPPER QUADRANT

[Series 1: us abdomen limited · 0.15mm/px · 14 of 45 slices shown]
[im 1/45]
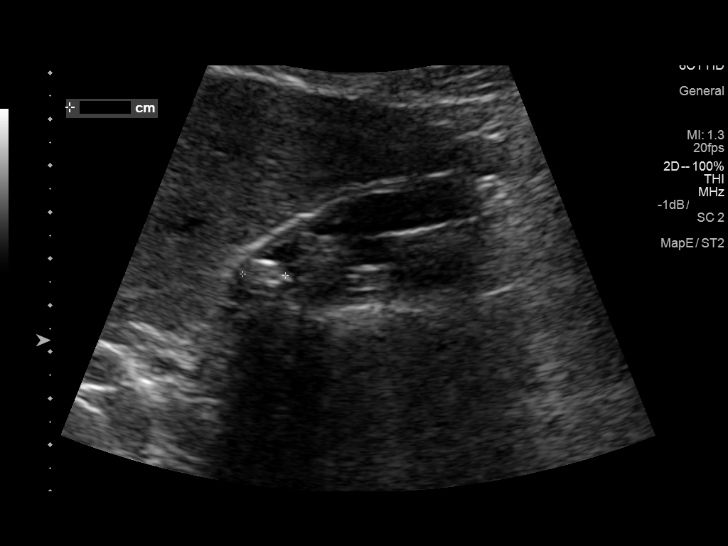
[im 4/45]
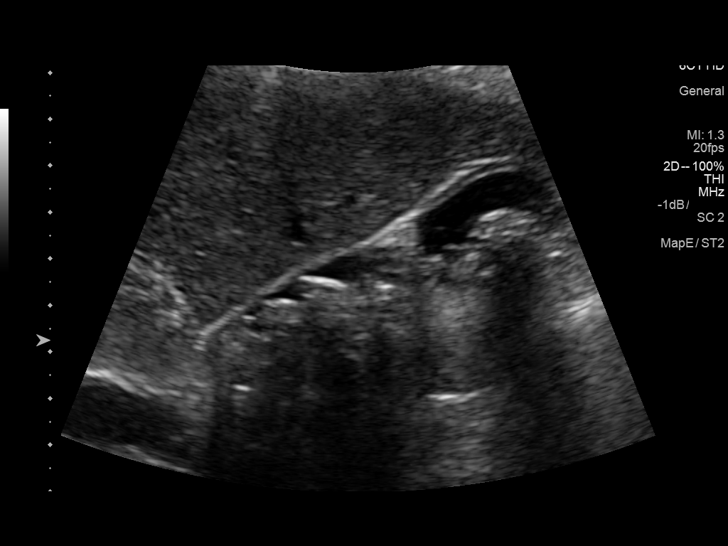
[im 8/45]
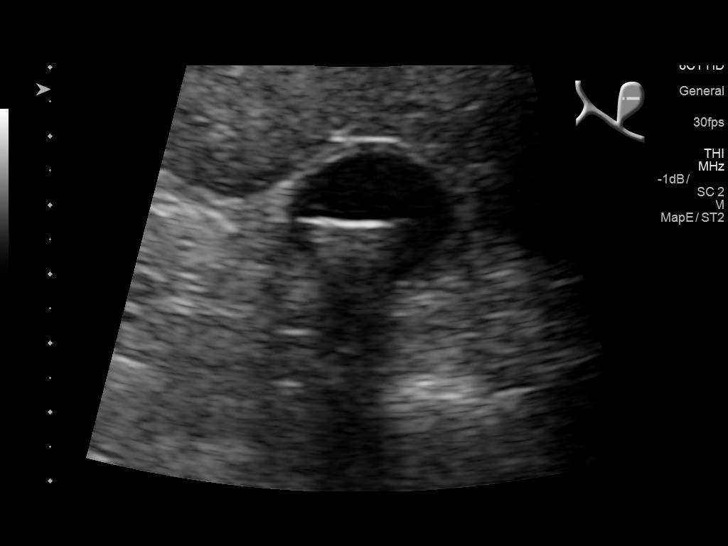
[im 12/45]
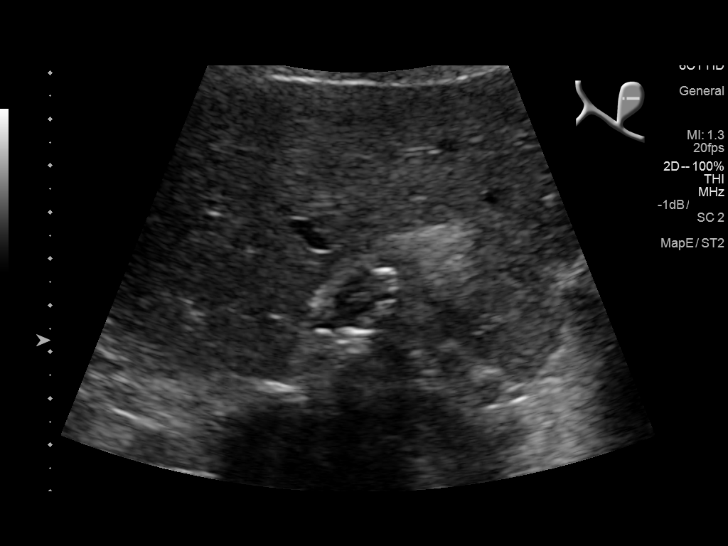
[im 15/45]
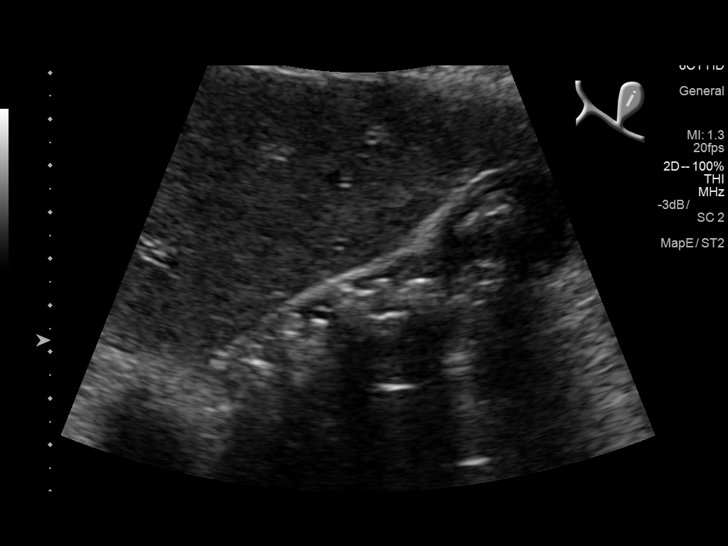
[im 17/45]
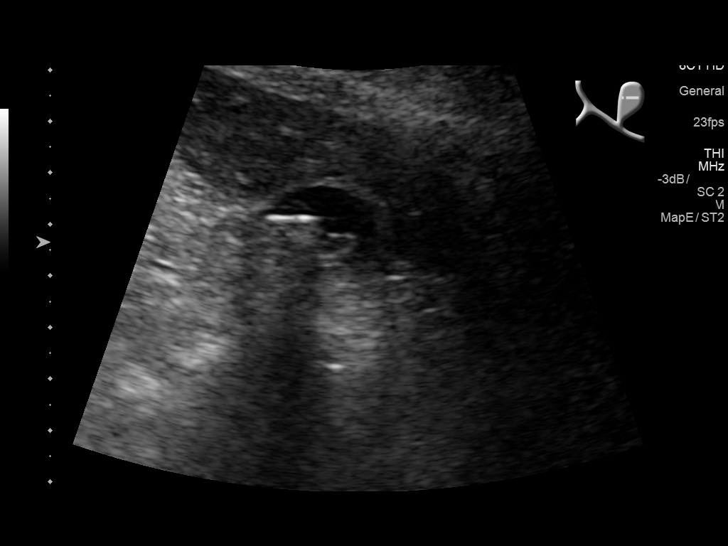
[im 21/45]
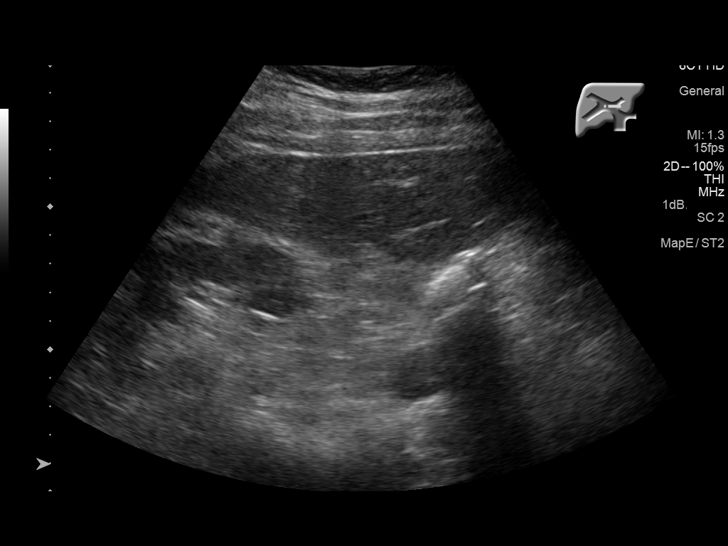
[im 24/45]
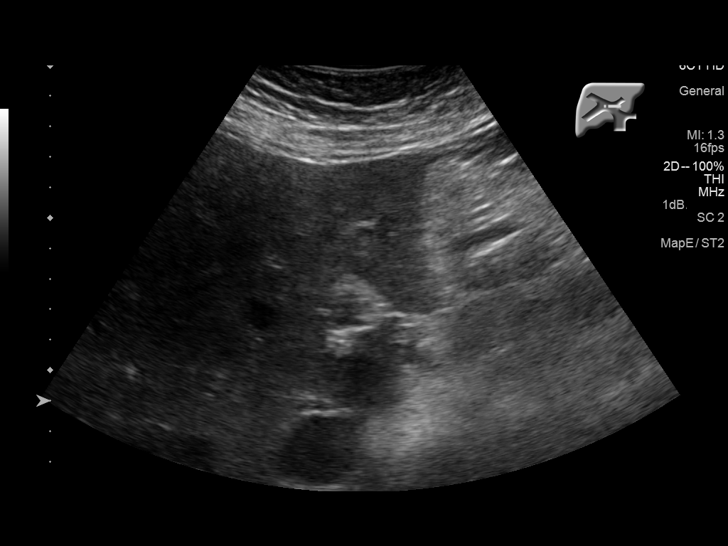
[im 28/45]
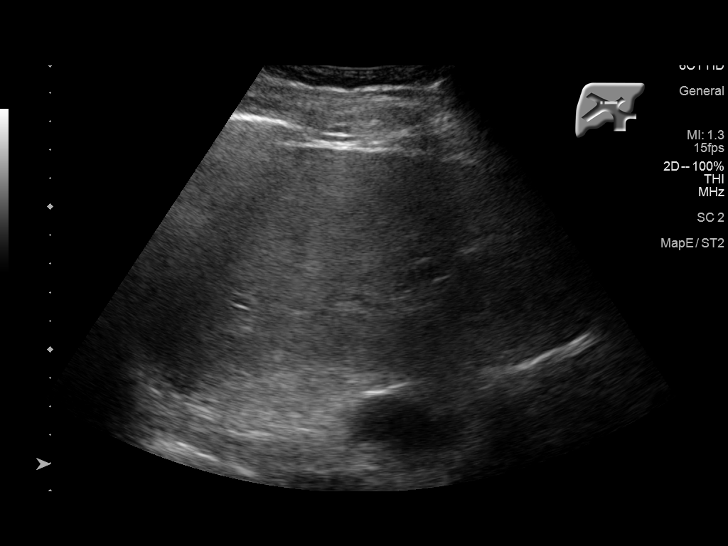
[im 30/45]
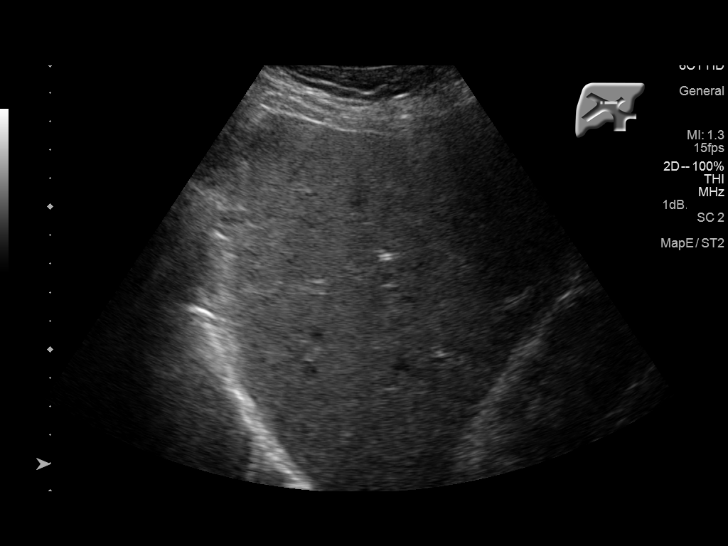
[im 34/45]
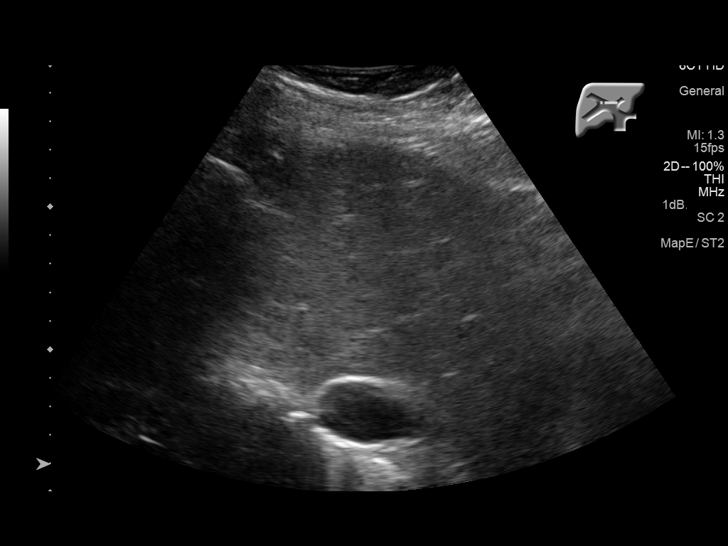
[im 37/45]
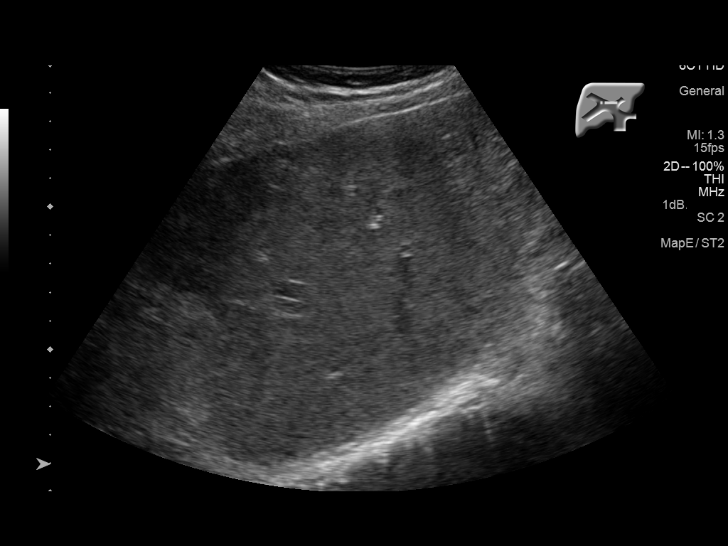
[im 41/45]
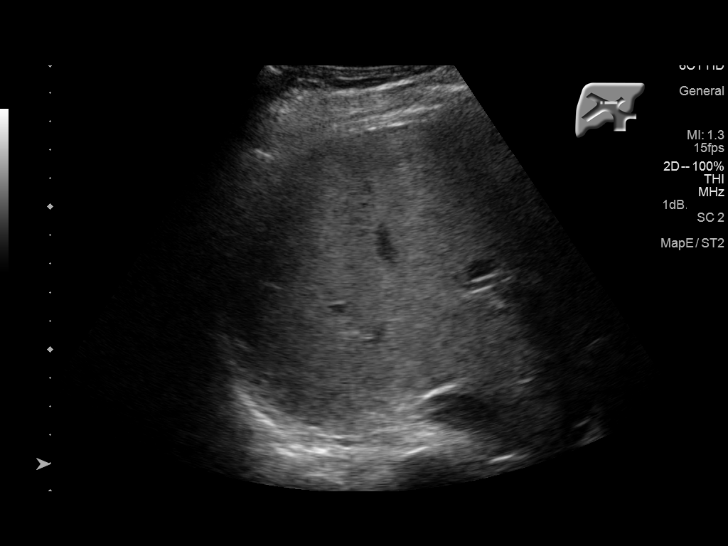
[im 45/45]
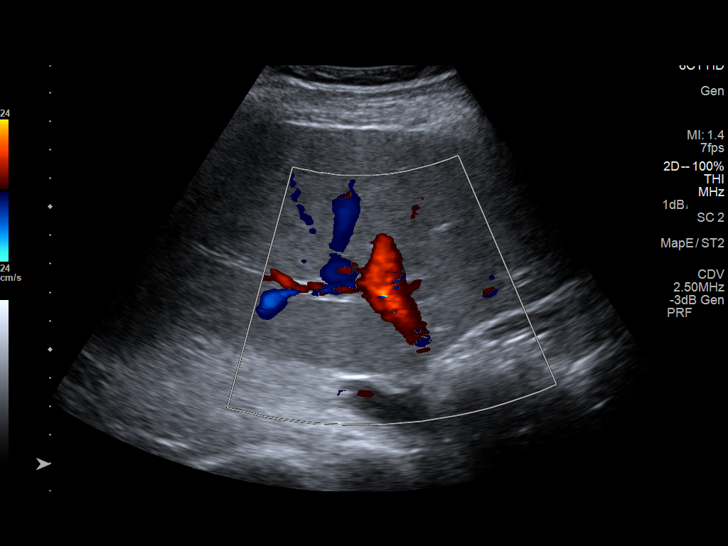

[14 of 25 positions shown; findings below may reference images not displayed]

FINDINGS: Gallbladder:

Multiple shadowing gallstones. Gallbladder wall is borderline
thickened at 3.7 mm. No pericholecystic fluid. No sonographic
Murphy's sign.

Common bile duct:

Diameter: 4.2 mm

Liver:

No focal lesion identified. Within normal limits in parenchymal
echogenicity.
IMPRESSION: 1. Multiple gallstones.  Note evidence of acute cholecystitis.
2. No bile duct dilation.  Normal appearance of the liver.

## 2018-11-04 ENCOUNTER — Other Ambulatory Visit: Payer: Self-pay | Admitting: Internal Medicine

## 2018-11-19 ENCOUNTER — Other Ambulatory Visit: Payer: Self-pay

## 2018-11-19 DIAGNOSIS — R6889 Other general symptoms and signs: Secondary | ICD-10-CM | POA: Diagnosis not present

## 2018-11-19 DIAGNOSIS — Z20822 Contact with and (suspected) exposure to covid-19: Secondary | ICD-10-CM

## 2018-11-20 LAB — NOVEL CORONAVIRUS, NAA: SARS-CoV-2, NAA: NOT DETECTED

## 2018-12-01 ENCOUNTER — Other Ambulatory Visit: Payer: Self-pay | Admitting: Internal Medicine

## 2018-12-01 NOTE — Telephone Encounter (Signed)
[place] Controlled Database Checked Last filled: 10/06/18 # 30 LOV w/you: 05/26/18 Next appt w/you: None

## 2018-12-03 ENCOUNTER — Telehealth: Payer: Self-pay | Admitting: Internal Medicine

## 2018-12-03 NOTE — Telephone Encounter (Signed)
Called patient to schedule AWV, no answer. Will try to call patient back at a later time. SF

## 2018-12-06 ENCOUNTER — Ambulatory Visit (INDEPENDENT_AMBULATORY_CARE_PROVIDER_SITE_OTHER): Payer: Medicare Other

## 2018-12-06 ENCOUNTER — Other Ambulatory Visit: Payer: Self-pay

## 2018-12-06 DIAGNOSIS — Z23 Encounter for immunization: Secondary | ICD-10-CM | POA: Diagnosis not present

## 2019-01-07 ENCOUNTER — Other Ambulatory Visit: Payer: Self-pay

## 2019-01-07 DIAGNOSIS — Z20822 Contact with and (suspected) exposure to covid-19: Secondary | ICD-10-CM

## 2019-01-07 DIAGNOSIS — Z20828 Contact with and (suspected) exposure to other viral communicable diseases: Secondary | ICD-10-CM | POA: Diagnosis not present

## 2019-01-09 LAB — NOVEL CORONAVIRUS, NAA: SARS-CoV-2, NAA: NOT DETECTED

## 2019-02-05 ENCOUNTER — Ambulatory Visit (INDEPENDENT_AMBULATORY_CARE_PROVIDER_SITE_OTHER): Payer: Medicare Other | Admitting: Internal Medicine

## 2019-02-05 ENCOUNTER — Other Ambulatory Visit: Payer: Self-pay

## 2019-02-05 ENCOUNTER — Other Ambulatory Visit (INDEPENDENT_AMBULATORY_CARE_PROVIDER_SITE_OTHER): Payer: Medicare Other

## 2019-02-05 ENCOUNTER — Encounter: Payer: Self-pay | Admitting: Internal Medicine

## 2019-02-05 VITALS — BP 134/80 | HR 64 | Temp 98.3°F | Ht 69.0 in | Wt 184.0 lb

## 2019-02-05 DIAGNOSIS — I10 Essential (primary) hypertension: Secondary | ICD-10-CM | POA: Diagnosis not present

## 2019-02-05 DIAGNOSIS — Z0000 Encounter for general adult medical examination without abnormal findings: Secondary | ICD-10-CM

## 2019-02-05 LAB — URINALYSIS
Bilirubin Urine: NEGATIVE
Hgb urine dipstick: NEGATIVE
Ketones, ur: NEGATIVE
Leukocytes,Ua: NEGATIVE
Nitrite: NEGATIVE
Specific Gravity, Urine: 1.025 (ref 1.000–1.030)
Urine Glucose: NEGATIVE
Urobilinogen, UA: 0.2 (ref 0.0–1.0)
pH: 6 (ref 5.0–8.0)

## 2019-02-05 LAB — CBC WITH DIFFERENTIAL/PLATELET
Basophils Absolute: 0 10*3/uL (ref 0.0–0.1)
Basophils Relative: 0.5 % (ref 0.0–3.0)
Eosinophils Absolute: 0.2 10*3/uL (ref 0.0–0.7)
Eosinophils Relative: 4 % (ref 0.0–5.0)
HCT: 43.5 % (ref 39.0–52.0)
Hemoglobin: 14.7 g/dL (ref 13.0–17.0)
Lymphocytes Relative: 28.3 % (ref 12.0–46.0)
Lymphs Abs: 1.7 10*3/uL (ref 0.7–4.0)
MCHC: 33.9 g/dL (ref 30.0–36.0)
MCV: 96.1 fl (ref 78.0–100.0)
Monocytes Absolute: 0.4 10*3/uL (ref 0.1–1.0)
Monocytes Relative: 7.2 % (ref 3.0–12.0)
Neutro Abs: 3.6 10*3/uL (ref 1.4–7.7)
Neutrophils Relative %: 60 % (ref 43.0–77.0)
Platelets: 197 10*3/uL (ref 150.0–400.0)
RBC: 4.52 Mil/uL (ref 4.22–5.81)
RDW: 13.4 % (ref 11.5–15.5)
WBC: 6.1 10*3/uL (ref 4.0–10.5)

## 2019-02-05 LAB — BASIC METABOLIC PANEL
BUN: 23 mg/dL (ref 6–23)
CO2: 32 mEq/L (ref 19–32)
Calcium: 9.5 mg/dL (ref 8.4–10.5)
Chloride: 105 mEq/L (ref 96–112)
Creatinine, Ser: 1.12 mg/dL (ref 0.40–1.50)
GFR: 62.84 mL/min (ref >60.00)
Glucose, Bld: 100 mg/dL — ABNORMAL HIGH (ref 70–99)
Potassium: 4.2 mEq/L (ref 3.5–5.1)
Sodium: 143 mEq/L (ref 135–145)

## 2019-02-05 LAB — PSA: PSA: 1.33 ng/mL (ref 0.10–4.00)

## 2019-02-05 LAB — HEPATIC FUNCTION PANEL
ALT: 19 U/L (ref 0–53)
AST: 23 U/L (ref 0–37)
Albumin: 4.2 g/dL (ref 3.5–5.2)
Alkaline Phosphatase: 69 U/L (ref 39–117)
Bilirubin, Direct: 0.1 mg/dL (ref 0.0–0.3)
Total Bilirubin: 0.7 mg/dL (ref 0.2–1.2)
Total Protein: 6.6 g/dL (ref 6.0–8.3)

## 2019-02-05 LAB — LIPID PANEL
Cholesterol: 175 mg/dL (ref 0–200)
HDL: 70.3 mg/dL (ref >39.00)
LDL Cholesterol: 93 mg/dL (ref 0–99)
NonHDL: 104.76
Total CHOL/HDL Ratio: 2
Triglycerides: 57 mg/dL (ref 0.0–149.0)
VLDL: 11.4 mg/dL (ref 0.0–40.0)

## 2019-02-05 LAB — TSH: TSH: 2.79 u[IU]/mL (ref 0.35–4.50)

## 2019-02-05 MED ORDER — LOSARTAN POTASSIUM 50 MG PO TABS: 50.0000 mg | ORAL_TABLET | Freq: Every day | ORAL | 3 refills | Status: DC

## 2019-02-05 MED ORDER — DIAZEPAM 5 MG PO TABS: 5.0000 mg | ORAL_TABLET | Freq: Every evening | ORAL | 1 refills | Status: DC | PRN

## 2019-02-05 NOTE — Patient Instructions (Signed)
If you have medicare related insurance (such as traditional Medicare, [employer], [employer], or similar), Please make an appointment at the scheduling desk with Sharee Pimple, the [employer], for your Wellness visit in this office, which is a benefit with your insurance.    These suggestions will probably help you to improve your metabolism if you are not overweight and to lose weight if you are overweight: 1.  Reduce your consumption of sugars and starches.  Eliminate high fructose corn syrup from your diet.  Reduce your consumption of processed foods.  For desserts try to have seasonal fruits, berries, nuts, cheeses or dark chocolate with more than 70% cacao. 2.  Do not snack 3.  You do not have to eat breakfast.  If you choose to have breakfast-eat plain greek yogurt, eggs, oatmeal (without sugar) 4.  Drink water, freshly brewed unsweetened tea (green, black or herbal) or coffee.  Do not drink sodas including diet sodas , juices, beverages sweetened with artificial sweeteners. 5.  Reduce your consumption of refined grains. 6.  Avoid protein drinks such as Optifast, Slim fast etc. Eat chicken, fish, meat, dairy and beans for your sources of protein 7.  Natural unprocessed fats like cold pressed virgin olive oil, butter, coconut oil are good for you.  Eat avocados 8.  Increase your consumption of fiber.  Fruits, berries, vegetables, whole grains, flaxseeds, Chia seeds, beans, popcorn, nuts, oatmeal are good sources of fiber 9.  Use vinegar in your diet, i.e. apple cider vinegar, red wine or balsamic vinegar 10.  You can try fasting.  For example you can skip breakfast and lunch every other day (24-hour fast) 11.  Stress reduction, good night sleep, relaxation, meditation, yoga and other physical activity is likely to help you to maintain low weight too. 12.  If you drink alcohol, limit your alcohol intake to no more than 2 drinks a day.

## 2019-02-05 NOTE — Assessment & Plan Note (Addendum)
We discussed age appropriate health related issues, including available/recomended screening tests and vaccinations. We discussed a need for adhering to healthy diet and exercise. Labs were ordered to be later reviewed . All questions were answered.   Colonoscopy - last in 2011 Cardiac CT calcium score "0" 2020 Shingrix rx B carotids clear 2017

## 2019-02-05 NOTE — Assessment & Plan Note (Signed)
Losartan  2020 cardiac CT scan for calcium scoring 0

## 2019-02-05 NOTE — Progress Notes (Signed)
Subjective:  Patient ID: [name], male    DOB: 1935/05/07  Age: 83 y.o. MRN: [id number]  CC: No chief complaint on file.   HPI DEMETRIE POTOSKY presents for a well exam Pt lost wt on diet   Outpatient Medications Prior to Visit  Medication Sig Dispense Refill  . diazepam (VALIUM) 5 MG tablet TAKE 1 TABLET AT BEDTIME AS NEEDED. 30 tablet 5  . diclofenac sodium (VOLTAREN) 1 % GEL Apply 4 g topically 4 (four) times daily. 200 g 3  . diphenhydrAMINE (BENADRYL) 25 MG tablet Take 1 tablet by mouth 3 (three) times daily as needed. 2 by mouth at bedtime     . losartan (COZAAR) 50 MG tablet Take 1 tablet (50 mg total) by mouth daily. **NEEDS APPOINTMENT** 90 tablet 0  . Magnesium 400 MG CAPS Take by mouth.    . Multiple Vitamin (MULTIVITAMIN) capsule Take 2 capsules by mouth daily.     . Omega-3 1000 MG CAPS Take 1 capsule by mouth daily.    . vitamin C (ASCORBIC ACID) 500 MG tablet Take 500 mg by mouth daily.    . tizanidine (ZANAFLEX) 2 MG capsule Take 1 capsule (2 mg total) by mouth 3 (three) times daily. Take 1-2 capsules (2 to 4 mg) every 8 hours as needed for neck pain 30 capsule 0   No facility-administered medications prior to visit.     ROS: Review of Systems  Constitutional: Negative for appetite change, fatigue and unexpected weight change.  HENT: Negative for congestion, nosebleeds, sneezing, sore throat and trouble swallowing.   Eyes: Negative for itching and visual disturbance.  Respiratory: Negative for cough.   Cardiovascular: Negative for chest pain, palpitations and leg swelling.  Gastrointestinal: Negative for abdominal distention, blood in stool, diarrhea and nausea.  Genitourinary: Negative for frequency and hematuria.  Musculoskeletal: Negative for back pain, gait problem, joint swelling and neck pain.  Skin: Negative for rash.  Neurological: Negative for dizziness, tremors, speech difficulty and weakness.  Psychiatric/Behavioral: Negative for agitation,  dysphoric mood and sleep disturbance. The patient is not nervous/anxious.     Objective:  BP 134/80 (BP Location: Left Arm, Patient Position: Sitting, Cuff Size: Normal)   Pulse 64   Temp 98.3 F (36.8 C) (Oral)   Ht 5\' 9"  (1.753 m)   Wt 184 lb (83.5 kg)   SpO2 98%   BMI 27.17 kg/m   BP Readings from Last 3 Encounters:  02/05/19 134/80  05/26/18 (!) 150/90  03/10/18 134/78    Wt Readings from Last 3 Encounters:  02/05/19 184 lb (83.5 kg)  05/26/18 194 lb (88 kg)  03/10/18 199 lb (90.3 kg)    Physical Exam Constitutional:      General: He is not in acute distress.    Appearance: He is well-developed.     Comments: NAD  Eyes:     Conjunctiva/sclera: Conjunctivae normal.     Pupils: Pupils are equal, round, and reactive to light.  Neck:     Musculoskeletal: Normal range of motion.     Thyroid: No thyromegaly.     Vascular: No JVD.  Cardiovascular:     Rate and Rhythm: Normal rate and regular rhythm.     Heart sounds: Normal heart sounds. No murmur. No friction rub. No gallop.   Pulmonary:     Effort: Pulmonary effort is normal. No respiratory distress.     Breath sounds: Normal breath sounds. No wheezing or rales.  Chest:     Chest  wall: No tenderness.  Abdominal:     General: Bowel sounds are normal. There is no distension.     Palpations: Abdomen is soft. There is no mass.     Tenderness: There is no abdominal tenderness. There is no guarding or rebound.  Genitourinary:    Prostate: Normal.     Rectum: Normal. Guaiac result negative.  Musculoskeletal: Normal range of motion.        General: No tenderness.  Lymphadenopathy:     Cervical: No cervical adenopathy.  Skin:    General: Skin is warm and dry.     Findings: No rash.  Neurological:     Mental Status: He is alert and oriented to person, place, and time.     Cranial Nerves: No cranial nerve deficit.     Motor: No abnormal muscle tone.     Coordination: Coordination normal.     Gait: Gait normal.      Deep Tendon Reflexes: Reflexes are normal and symmetric.  Psychiatric:        Behavior: Behavior normal.        Thought Content: Thought content normal.        Judgment: Judgment normal.     Lab Results  Component Value Date   WBC 6.3 11/28/2017   HGB 15.0 11/28/2017   HCT 43.9 11/28/2017   PLT 207.0 11/28/2017   GLUCOSE 108 (H) 11/28/2017   CHOL 181 11/28/2017   TRIG 61.0 11/28/2017   HDL 67.10 11/28/2017   LDLDIRECT 119.5 09/24/2006   LDLCALC 102 (H) 11/28/2017   ALT 19 11/28/2017   AST 28 11/28/2017   NA 142 11/28/2017   K 3.1 (L) 11/28/2017   CL 97 11/28/2017   CREATININE 1.33 11/28/2017   BUN 33 (H) 11/28/2017   CO2 36 (H) 11/28/2017   TSH 2.95 11/28/2017   PSA 1.46 11/28/2017    Dg Cervical Spine Complete  Result Date: 05/26/2018 CLINICAL DATA:  Neck pain with decreased range of motion for 2 days, no known injury, initial encounter EXAM: CERVICAL SPINE - COMPLETE 4+ VIEW COMPARISON:  04/12/2009 FINDINGS: There again noted changes consistent with fusion extending from C3-C7 with anterior fixation. Osteophytic changes at C2-3 are noted. No prevertebral soft tissue changes are noted. No hardware failure is seen. The neural foramina show narrowing at 6 7 bilaterally. No soft tissue abnormality is seen. The odontoid is within normal limits. IMPRESSION: Postsurgical and degenerative changes without acute abnormality Electronically Signed   By: Inez Catalina M.D.   On: 05/26/2018 15:25    Assessment & Plan:   There are no diagnoses linked to this encounter.   No orders of the defined types were placed in this encounter.    Follow-up: No follow-ups on file.  Walker Kehr, MD

## 2019-04-08 DIAGNOSIS — L819 Disorder of pigmentation, unspecified: Secondary | ICD-10-CM | POA: Diagnosis not present

## 2019-04-08 DIAGNOSIS — L821 Other seborrheic keratosis: Secondary | ICD-10-CM | POA: Diagnosis not present

## 2019-04-08 DIAGNOSIS — Z85828 Personal history of other malignant neoplasm of skin: Secondary | ICD-10-CM | POA: Diagnosis not present

## 2019-04-08 DIAGNOSIS — L570 Actinic keratosis: Secondary | ICD-10-CM | POA: Diagnosis not present

## 2019-05-19 DIAGNOSIS — M25551 Pain in right hip: Secondary | ICD-10-CM | POA: Diagnosis not present

## 2019-05-22 ENCOUNTER — Telehealth: Payer: Self-pay | Admitting: Internal Medicine

## 2019-05-22 NOTE — Telephone Encounter (Signed)
Rec'd from Raliegh Ip forwarded 2 pages to Dr. Lew Dawes

## 2019-06-16 DIAGNOSIS — H52223 Regular astigmatism, bilateral: Secondary | ICD-10-CM | POA: Diagnosis not present

## 2019-06-22 DIAGNOSIS — H5213 Myopia, bilateral: Secondary | ICD-10-CM | POA: Diagnosis not present

## 2019-08-24 ENCOUNTER — Other Ambulatory Visit: Payer: Self-pay | Admitting: Internal Medicine

## 2019-08-24 NOTE — Telephone Encounter (Signed)
[facility] Controlled Database Checked Last filled: 05/30/19  # 90 LOV w/you: 02/05/19 Next appt w/you: None

## 2019-11-05 DIAGNOSIS — S0093XA Contusion of unspecified part of head, initial encounter: Secondary | ICD-10-CM | POA: Diagnosis not present

## 2019-11-05 DIAGNOSIS — S0990XA Unspecified injury of head, initial encounter: Secondary | ICD-10-CM | POA: Diagnosis not present

## 2019-11-12 ENCOUNTER — Ambulatory Visit: Payer: Medicare Other | Admitting: Internal Medicine

## 2019-11-12 ENCOUNTER — Other Ambulatory Visit: Payer: Self-pay

## 2019-11-12 ENCOUNTER — Encounter: Payer: Self-pay | Admitting: Internal Medicine

## 2019-11-12 VITALS — BP 126/70 | HR 65 | Temp 98.3°F | Ht 69.0 in | Wt 187.0 lb

## 2019-11-12 DIAGNOSIS — R252 Cramp and spasm: Secondary | ICD-10-CM | POA: Diagnosis not present

## 2019-11-12 DIAGNOSIS — I10 Essential (primary) hypertension: Secondary | ICD-10-CM | POA: Diagnosis not present

## 2019-11-12 LAB — COMPLETE METABOLIC PANEL WITH GFR
AG Ratio: 1.7 (calc) (ref 1.0–2.5)
ALT: 21 U/L (ref 9–46)
AST: 27 U/L (ref 10–35)
Albumin: 3.9 g/dL (ref 3.6–5.1)
Alkaline phosphatase (APISO): 54 U/L (ref 35–144)
BUN/Creatinine Ratio: 21 (calc) (ref 6–22)
BUN: 28 mg/dL — ABNORMAL HIGH (ref 7–25)
CO2: 29 mmol/L (ref 20–32)
Calcium: 9.3 mg/dL (ref 8.6–10.3)
Chloride: 106 mmol/L (ref 98–110)
Creat: 1.32 mg/dL — ABNORMAL HIGH (ref 0.70–1.11)
GFR, Est African American: 58 mL/min/{1.73_m2} — ABNORMAL LOW (ref >=60)
GFR, Est Non African American: 50 mL/min/{1.73_m2} — ABNORMAL LOW (ref >=60)
Globulin: 2.3 g/dL (calc) (ref 1.9–3.7)
Glucose, Bld: 77 mg/dL (ref 65–99)
Potassium: 4.3 mmol/L (ref 3.5–5.3)
Sodium: 142 mmol/L (ref 135–146)
Total Bilirubin: 0.7 mg/dL (ref 0.2–1.2)
Total Protein: 6.2 g/dL (ref 6.1–8.1)

## 2019-11-12 MED ORDER — BACLOFEN 10 MG PO TABS: 10.0000 mg | ORAL_TABLET | Freq: Every day | ORAL | 5 refills | Status: DC

## 2019-11-12 NOTE — Progress Notes (Signed)
Subjective:  Patient ID: [name], male    DOB: [birth year]  Age: 84 y.o. MRN: [id number]  CC: No chief complaint on file.   HPI [name] presents for leg cramps - exercising a lot: running in water 45 min in am and riding a st bike for 45 min at night  F/u HTN  Outpatient Medications Prior to Visit  Medication Sig Dispense Refill  . diazepam (VALIUM) 5 MG tablet TAKE 1 TABLET AT BEDTIME AS NEEDED. 90 tablet 1  . diclofenac sodium (VOLTAREN) 1 % GEL Apply 4 g topically 4 (four) times daily. 200 g 3  . diphenhydrAMINE (BENADRYL) 25 MG tablet Take 1 tablet by mouth 3 (three) times daily as needed. 2 by mouth at bedtime     . losartan (COZAAR) 50 MG tablet Take 1 tablet (50 mg total) by mouth daily. 90 tablet 3  . Magnesium 400 MG CAPS Take by mouth.    . Multiple Vitamin (MULTIVITAMIN) capsule Take 2 capsules by mouth daily.     . Omega-3 1000 MG CAPS Take 1 capsule by mouth daily.    . vitamin C (ASCORBIC ACID) 500 MG tablet Take 500 mg by mouth daily.     No facility-administered medications prior to visit.    ROS: Review of Systems  Constitutional: Negative for appetite change, fatigue and unexpected weight change.  HENT: Negative for congestion, nosebleeds, sneezing, sore throat and trouble swallowing.   Eyes: Negative for itching and visual disturbance.  Respiratory: Negative for cough.   Cardiovascular: Negative for chest pain, palpitations and leg swelling.  Gastrointestinal: Negative for abdominal distention, blood in stool, diarrhea and nausea.  Genitourinary: Negative for frequency and hematuria.  Musculoskeletal: Negative for back pain, gait problem, joint swelling and neck pain.  Skin: Negative for rash.  Neurological: Negative for dizziness, tremors, speech difficulty and weakness.  Psychiatric/Behavioral: Negative for agitation, dysphoric mood and sleep disturbance. The patient is not nervous/anxious.     Objective:  BP 126/70 (BP Location: Right  Arm, Patient Position: Sitting, Cuff Size: Large)   Pulse 65   Temp 98.3 F (36.8 C) (Oral)   Ht 5\' 9"  (1.753 m)   Wt 187 lb (84.8 kg)   SpO2 97%   BMI 27.62 kg/m   BP Readings from Last 3 Encounters:  11/12/19 126/70  02/05/19 134/80  05/26/18 (!) 150/90    Wt Readings from Last 3 Encounters:  11/12/19 187 lb (84.8 kg)  02/05/19 184 lb (83.5 kg)  05/26/18 194 lb (88 kg)    Physical Exam Constitutional:      General: He is not in acute distress.    Appearance: He is well-developed.     Comments: NAD  Eyes:     Conjunctiva/sclera: Conjunctivae normal.     Pupils: Pupils are equal, round, and reactive to light.  Neck:     Thyroid: No thyromegaly.     Vascular: No JVD.  Cardiovascular:     Rate and Rhythm: Normal rate and regular rhythm.     Heart sounds: Normal heart sounds. No murmur heard.  No friction rub. No gallop.   Pulmonary:     Effort: Pulmonary effort is normal. No respiratory distress.     Breath sounds: Normal breath sounds. No wheezing or rales.  Chest:     Chest wall: No tenderness.  Abdominal:     General: Bowel sounds are normal. There is no distension.     Palpations: Abdomen is soft. There is no mass.  Tenderness: There is no abdominal tenderness. There is no guarding or rebound.  Musculoskeletal:        General: No tenderness. Normal range of motion.     Cervical back: Normal range of motion.  Lymphadenopathy:     Cervical: No cervical adenopathy.  Skin:    General: Skin is warm and dry.     Findings: No rash.  Neurological:     Mental Status: He is alert and oriented to person, place, and time.     Cranial Nerves: No cranial nerve deficit.     Motor: No abnormal muscle tone.     Coordination: Coordination normal.     Gait: Gait normal.     Deep Tendon Reflexes: Reflexes are normal and symmetric.  Psychiatric:        Behavior: Behavior normal.        Thought Content: Thought content normal.        Judgment: Judgment normal.      Lab Results  Component Value Date   WBC 6.1 02/05/2019   HGB 14.7 02/05/2019   HCT 43.5 02/05/2019   PLT 197.0 02/05/2019   GLUCOSE 100 (H) 02/05/2019   CHOL 175 02/05/2019   TRIG 57.0 02/05/2019   HDL 70.30 02/05/2019   LDLDIRECT 119.5 09/24/2006   LDLCALC 93 02/05/2019   ALT 19 02/05/2019   AST 23 02/05/2019   NA 143 02/05/2019   K 4.2 02/05/2019   CL 105 02/05/2019   CREATININE 1.12 02/05/2019   BUN 23 02/05/2019   CO2 32 02/05/2019   TSH 2.79 02/05/2019   PSA 1.33 02/05/2019    DG Cervical Spine Complete  Result Date: 05/26/2018 CLINICAL DATA:  Neck pain with decreased range of motion for 2 days, no known injury, initial encounter EXAM: CERVICAL SPINE - COMPLETE 4+ VIEW COMPARISON:  04/12/2009 FINDINGS: There again noted changes consistent with fusion extending from C3-C7 with anterior fixation. Osteophytic changes at C2-3 are noted. No prevertebral soft tissue changes are noted. No hardware failure is seen. The neural foramina show narrowing at 6 7 bilaterally. No soft tissue abnormality is seen. The odontoid is within normal limits. IMPRESSION: Postsurgical and degenerative changes without acute abnormality Electronically Signed   By: Inez Catalina M.D.   On: 05/26/2018 15:25    Assessment & Plan:   Walker Kehr, MD

## 2019-11-12 NOTE — Assessment & Plan Note (Addendum)
exercising a lot: running in water 45 min in am and riding a st bike for 45 min at night -   Try CoQ10 for cramps  Don't exercise late at night  Drink Gatorade if sweating a lot  Labs Baclofen at hs if not better

## 2019-11-12 NOTE — Assessment & Plan Note (Signed)
Losartan 

## 2019-11-12 NOTE — Patient Instructions (Addendum)
Try CoQ10 for cramps  Don't exercise late at night  Drink Gatorade if sweating a lot

## 2019-11-13 LAB — CBC WITH DIFFERENTIAL/PLATELET
Absolute Monocytes: 461 cells/uL (ref 200–950)
Basophils Absolute: 32 cells/uL (ref 0–200)
Basophils Relative: 0.6 %
Eosinophils Absolute: 212 cells/uL (ref 15–500)
Eosinophils Relative: 4 %
HCT: 44.3 % (ref 38.5–50.0)
Hemoglobin: 14.7 g/dL (ref 13.2–17.1)
Lymphs Abs: 1701 cells/uL (ref 850–3900)
MCH: 31.9 pg (ref 27.0–33.0)
MCHC: 33.2 g/dL (ref 32.0–36.0)
MCV: 96.1 fL (ref 80.0–100.0)
MPV: 11.3 fL (ref 7.5–12.5)
Monocytes Relative: 8.7 %
Neutro Abs: 2894 cells/uL (ref 1500–7800)
Neutrophils Relative %: 54.6 %
Platelets: 172 10*3/uL (ref 140–400)
RBC: 4.61 10*6/uL (ref 4.20–5.80)
RDW: 12.5 % (ref 11.0–15.0)
Total Lymphocyte: 32.1 %
WBC: 5.3 10*3/uL (ref 3.8–10.8)

## 2019-11-13 LAB — CK: Total CK: 169 U/L (ref 44–196)

## 2019-11-13 LAB — TSH: TSH: 2.74 mIU/L (ref 0.40–4.50)

## 2019-11-13 LAB — MAGNESIUM: Magnesium: 2.4 mg/dL (ref 1.5–2.5)

## 2020-01-08 ENCOUNTER — Ambulatory Visit (INDEPENDENT_AMBULATORY_CARE_PROVIDER_SITE_OTHER): Payer: Medicare Other | Admitting: *Deleted

## 2020-01-08 ENCOUNTER — Other Ambulatory Visit: Payer: Self-pay

## 2020-01-08 DIAGNOSIS — Z23 Encounter for immunization: Secondary | ICD-10-CM | POA: Diagnosis not present

## 2020-01-23 ENCOUNTER — Other Ambulatory Visit: Payer: Self-pay | Admitting: Internal Medicine

## 2020-02-19 ENCOUNTER — Other Ambulatory Visit: Payer: Self-pay | Admitting: Internal Medicine

## 2020-04-03 DIAGNOSIS — I10 Essential (primary) hypertension: Secondary | ICD-10-CM | POA: Diagnosis not present

## 2020-04-03 DIAGNOSIS — T7840XA Allergy, unspecified, initial encounter: Secondary | ICD-10-CM | POA: Diagnosis not present

## 2020-04-05 DIAGNOSIS — D2261 Melanocytic nevi of right upper limb, including shoulder: Secondary | ICD-10-CM | POA: Diagnosis not present

## 2020-04-05 DIAGNOSIS — Z85828 Personal history of other malignant neoplasm of skin: Secondary | ICD-10-CM | POA: Diagnosis not present

## 2020-04-05 DIAGNOSIS — L814 Other melanin hyperpigmentation: Secondary | ICD-10-CM | POA: Diagnosis not present

## 2020-04-05 DIAGNOSIS — D692 Other nonthrombocytopenic purpura: Secondary | ICD-10-CM | POA: Diagnosis not present

## 2020-06-17 DIAGNOSIS — M21622 Bunionette of left foot: Secondary | ICD-10-CM | POA: Diagnosis not present

## 2020-06-17 DIAGNOSIS — M792 Neuralgia and neuritis, unspecified: Secondary | ICD-10-CM | POA: Diagnosis not present

## 2020-06-17 DIAGNOSIS — M21621 Bunionette of right foot: Secondary | ICD-10-CM | POA: Diagnosis not present

## 2020-06-17 DIAGNOSIS — M2042 Other hammer toe(s) (acquired), left foot: Secondary | ICD-10-CM | POA: Diagnosis not present

## 2020-07-05 DIAGNOSIS — Z85828 Personal history of other malignant neoplasm of skin: Secondary | ICD-10-CM | POA: Diagnosis not present

## 2020-07-05 DIAGNOSIS — L111 Transient acantholytic dermatosis [Grover]: Secondary | ICD-10-CM | POA: Diagnosis not present

## 2020-08-10 ENCOUNTER — Telehealth: Payer: Self-pay | Admitting: Internal Medicine

## 2020-08-10 DIAGNOSIS — U071 COVID-19: Secondary | ICD-10-CM

## 2020-08-10 MED ORDER — PAXLOVID 20 X 150 MG & 10 X 100MG PO TBPK: 3.0000 | ORAL_TABLET | Freq: Two times a day (BID) | ORAL | 0 refills | Status: DC

## 2020-08-10 NOTE — Telephone Encounter (Addendum)
Pt  Has contacted me. Sent Rx to [facility] - Paxlovid GFR 50

## 2020-08-10 NOTE — Telephone Encounter (Signed)
Received call per on call service  Pt is calling and states tested + home COVID TESTING today  Symptoms start x 2 -3 days  Has minor URI symptoms only, fatigue  Pt s/p vax and boostser x 2  A/p  - for amb referral covid care as is likely good candidate for paxlovid - pt hopefully to hear soon such as tomorrow  Cathlean Cower, MD

## 2020-08-10 NOTE — Addendum Note (Signed)
Addended by: Cassandria Anger on: 08/10/2020 10:07 PM   Modules accepted: Orders

## 2020-08-11 ENCOUNTER — Telehealth: Payer: Self-pay

## 2020-08-11 ENCOUNTER — Telehealth: Payer: Self-pay | Admitting: Internal Medicine

## 2020-08-11 NOTE — Telephone Encounter (Signed)
[facility] Report/Call: ---Caller stated tested positive for Covid19 today. Caller has sore throat, runny nose and no fever. Coughing Could the caller get a script for Paxlovid to help? Symptoms started 2-3 days ago has been testing negative. Oxygen 96-97%. [place] [phone].  Advised: Patient notified that Dr. Jenny Reichmann on-call will send ambulatory referral to [facility] and someone will contact patient tomorrow to discuss treatment options.

## 2020-08-11 NOTE — Telephone Encounter (Signed)
Called to discuss with patient about COVID-19 symptoms and the use of one of the available treatments for those with mild to moderate Covid symptoms and at a high risk of hospitalization.  Pt appears to qualify for outpatient treatment due to co-morbid conditions and/or a member of an at-risk group in accordance with the FDA Emergency Use Authorization.    Symptom onset: 08/09/20 Runny nose,sore throat Vaccinated: Yes Booster? Yes Immunocompromised? No Qualifiers: HTN NIH Criteria: Tier 3  Started Paxlovid. Declines any further treatment.  Marcello Moores

## 2020-08-21 ENCOUNTER — Other Ambulatory Visit: Payer: Self-pay | Admitting: Internal Medicine

## 2020-08-23 DIAGNOSIS — H524 Presbyopia: Secondary | ICD-10-CM | POA: Diagnosis not present

## 2020-09-23 DIAGNOSIS — I10 Essential (primary) hypertension: Secondary | ICD-10-CM | POA: Diagnosis not present

## 2020-09-23 DIAGNOSIS — L509 Urticaria, unspecified: Secondary | ICD-10-CM | POA: Diagnosis not present

## 2020-09-23 DIAGNOSIS — L249 Irritant contact dermatitis, unspecified cause: Secondary | ICD-10-CM | POA: Diagnosis not present

## 2020-09-26 DIAGNOSIS — B353 Tinea pedis: Secondary | ICD-10-CM | POA: Diagnosis not present

## 2020-09-26 DIAGNOSIS — Z85828 Personal history of other malignant neoplasm of skin: Secondary | ICD-10-CM | POA: Diagnosis not present

## 2020-10-26 DIAGNOSIS — B353 Tinea pedis: Secondary | ICD-10-CM | POA: Diagnosis not present

## 2020-10-26 DIAGNOSIS — Z79899 Other long term (current) drug therapy: Secondary | ICD-10-CM | POA: Diagnosis not present

## 2021-01-12 ENCOUNTER — Other Ambulatory Visit: Payer: Self-pay | Admitting: Internal Medicine

## 2021-01-20 ENCOUNTER — Other Ambulatory Visit: Payer: Self-pay

## 2021-01-20 ENCOUNTER — Ambulatory Visit (INDEPENDENT_AMBULATORY_CARE_PROVIDER_SITE_OTHER): Payer: Medicare Other | Admitting: *Deleted

## 2021-01-20 DIAGNOSIS — Z23 Encounter for immunization: Secondary | ICD-10-CM | POA: Diagnosis not present

## 2021-02-18 ENCOUNTER — Other Ambulatory Visit: Payer: Self-pay | Admitting: Internal Medicine

## 2021-02-23 ENCOUNTER — Other Ambulatory Visit: Payer: Self-pay | Admitting: Internal Medicine

## 2021-02-27 ENCOUNTER — Other Ambulatory Visit: Payer: Self-pay | Admitting: *Deleted

## 2021-02-27 MED ORDER — DIAZEPAM 5 MG PO TABS
5.0000 mg | ORAL_TABLET | Freq: Every evening | ORAL | 0 refills | Status: DC | PRN
Start: 2021-02-27 — End: 2021-05-26

## 2021-02-27 NOTE — Telephone Encounter (Signed)
-----   Message from Cassandria Anger, MD sent at 02/23/2021 10:49 AM EST ----- Regarding: Rx refill [name], Could someone renew his Diazepam please? Thx

## 2021-05-23 ENCOUNTER — Ambulatory Visit (INDEPENDENT_AMBULATORY_CARE_PROVIDER_SITE_OTHER): Payer: Medicare Other

## 2021-05-23 ENCOUNTER — Telehealth: Payer: Self-pay | Admitting: Internal Medicine

## 2021-05-23 ENCOUNTER — Ambulatory Visit (INDEPENDENT_AMBULATORY_CARE_PROVIDER_SITE_OTHER): Payer: Medicare Other | Admitting: Internal Medicine

## 2021-05-23 ENCOUNTER — Other Ambulatory Visit: Payer: Self-pay

## 2021-05-23 ENCOUNTER — Encounter: Payer: Self-pay | Admitting: Internal Medicine

## 2021-05-23 DIAGNOSIS — I10 Essential (primary) hypertension: Secondary | ICD-10-CM

## 2021-05-23 DIAGNOSIS — J209 Acute bronchitis, unspecified: Secondary | ICD-10-CM

## 2021-05-23 DIAGNOSIS — R059 Cough, unspecified: Secondary | ICD-10-CM | POA: Diagnosis not present

## 2021-05-23 MED ORDER — AZITHROMYCIN 250 MG PO TABS: ORAL_TABLET | ORAL | 0 refills | Status: DC

## 2021-05-23 MED ORDER — HYDROCODONE BIT-HOMATROP MBR 5-1.5 MG/5ML PO SOLN: 5.0000 mL | Freq: Four times a day (QID) | ORAL | 0 refills | Status: AC | PRN

## 2021-05-23 NOTE — Progress Notes (Signed)
Subjective:  Patient ID: [name], male    DOB: May 20, 1935  Age: 86 y.o. MRN: [id number]  CC: Chest Cold (Pt mentioned that he has been coughing up yellow mucus. Negative covid test yesterday. He has been taking otc mucinex. )   HPI [name] presents for URI sx's since Thursday, cough; yellow sputum, chills, sweats   Outpatient Medications Prior to Visit  Medication Sig Dispense Refill   diphenhydrAMINE (BENADRYL) 25 MG tablet Take 1 tablet by mouth 3 (three) times daily as needed. 2 by mouth at bedtime      losartan (COZAAR) 25 MG tablet TAKE (2) TABLETS BY MOUTH DAILY. 180 tablet 2   Magnesium 400 MG CAPS Take by mouth.     Multiple Vitamin (MULTIVITAMIN) capsule Take 2 capsules by mouth daily.      Omega-3 1000 MG CAPS Take 1 capsule by mouth daily.     vitamin C (ASCORBIC ACID) 500 MG tablet Take 500 mg by mouth daily.     diazepam (VALIUM) 5 MG tablet Take 1 tablet (5 mg total) by mouth at bedtime as needed. 90 tablet 0   baclofen (LIORESAL) 10 MG tablet Take 1 tablet (10 mg total) by mouth at bedtime. For cramps 30 each 5   diclofenac sodium (VOLTAREN) 1 % GEL Apply 4 g topically 4 (four) times daily. 200 g 3   losartan (COZAAR) 50 MG tablet TAKE 1 TABLET ONCE DAILY. 90 tablet 3   Nirmatrelvir & Ritonavir (PAXLOVID) 20 x 150 MG & 10 x 100MG  TBPK Take 3 tablets by mouth in the morning and at bedtime. As directed on a package 30 tablet 0   No facility-administered medications prior to visit.    ROS: Review of Systems  Constitutional:  Negative for appetite change, fatigue and unexpected weight change.  HENT:  Negative for congestion, nosebleeds, sneezing, sore throat and trouble swallowing.   Eyes:  Negative for itching and visual disturbance.  Respiratory:  Positive for cough. Negative for shortness of breath and wheezing.   Cardiovascular:  Negative for chest pain, palpitations and leg swelling.  Gastrointestinal:  Negative for abdominal distention, blood in  stool, diarrhea and nausea.  Genitourinary:  Negative for frequency and hematuria.  Musculoskeletal:  Negative for back pain, gait problem, joint swelling and neck pain.  Skin:  Negative for rash.  Neurological:  Negative for dizziness, tremors, speech difficulty and weakness.  Psychiatric/Behavioral:  Negative for agitation, dysphoric mood and sleep disturbance. The patient is not nervous/anxious.    Objective:  BP 130/72    Pulse 72    Temp 98.1 F (36.7 C) (Oral)    Resp 18    Ht 5\' 9"  (1.753 m)    Wt 192 lb (87.1 kg)    SpO2 100%    BMI 28.35 kg/m   BP Readings from Last 3 Encounters:  05/23/21 130/72  11/12/19 126/70  02/05/19 134/80    Wt Readings from Last 3 Encounters:  05/23/21 192 lb (87.1 kg)  11/12/19 187 lb (84.8 kg)  02/05/19 184 lb (83.5 kg)    Physical Exam Constitutional:      General: He is not in acute distress.    Appearance: Normal appearance. He is well-developed. He is not toxic-appearing.     Comments: NAD  Eyes:     Conjunctiva/sclera: Conjunctivae normal.     Pupils: Pupils are equal, round, and reactive to light.  Neck:     Thyroid: No thyromegaly.     Vascular: No  JVD.  Cardiovascular:     Rate and Rhythm: Normal rate and regular rhythm.     Heart sounds: Normal heart sounds. No murmur heard.   No friction rub. No gallop.  Pulmonary:     Effort: Pulmonary effort is normal. No respiratory distress.     Breath sounds: Rales present. No wheezing.  Chest:     Chest wall: No tenderness.  Abdominal:     General: Bowel sounds are normal. There is no distension.     Palpations: Abdomen is soft. There is no mass.     Tenderness: There is no abdominal tenderness. There is no guarding or rebound.  Musculoskeletal:        General: No tenderness. Normal range of motion.     Cervical back: Normal range of motion.  Lymphadenopathy:     Cervical: No cervical adenopathy.  Skin:    General: Skin is warm and dry.     Findings: No rash.  Neurological:      Mental Status: He is alert and oriented to person, place, and time.     Cranial Nerves: No cranial nerve deficit.     Motor: No abnormal muscle tone.     Coordination: Coordination normal.     Gait: Gait normal.     Deep Tendon Reflexes: Reflexes are normal and symmetric.  Psychiatric:        Behavior: Behavior normal.        Thought Content: Thought content normal.        Judgment: Judgment normal.  L base rales    A total time of 30 minutes was spent preparing to see the patient, reviewing tests, x-rays, operative reports and other medical records.  Also, obtaining history and performing comprehensive physical exam.  Additionally, counseling the patient regarding the above listed issues - URI, possible pneumonia  Lab Results  Component Value Date   WBC 9.3 05/29/2021   HGB 13.1 05/29/2021   HCT 39.4 05/29/2021   PLT 253.0 05/29/2021   GLUCOSE 140 (H) 05/29/2021   CHOL 175 02/05/2019   TRIG 57.0 02/05/2019   HDL 70.30 02/05/2019   LDLDIRECT 119.5 09/24/2006   LDLCALC 93 02/05/2019   ALT 37 05/29/2021   AST 36 05/29/2021   NA 140 05/29/2021   K 3.7 05/29/2021   CL 104 05/29/2021   CREATININE 1.30 05/29/2021   BUN 21 05/29/2021   CO2 32 05/29/2021   TSH 2.74 11/12/2019   PSA 1.33 02/05/2019    DG Cervical Spine Complete  Result Date: 05/26/2018 CLINICAL DATA:  Neck pain with decreased range of motion for 2 days, no known injury, initial encounter EXAM: CERVICAL SPINE - COMPLETE 4+ VIEW COMPARISON:  04/12/2009 FINDINGS: There again noted changes consistent with fusion extending from C3-C7 with anterior fixation. Osteophytic changes at C2-3 are noted. No prevertebral soft tissue changes are noted. No hardware failure is seen. The neural foramina show narrowing at 6 7 bilaterally. No soft tissue abnormality is seen. The odontoid is within normal limits. IMPRESSION: Postsurgical and degenerative changes without acute abnormality Electronically Signed   By: [name] M.D.    On: 05/26/2018 15:25    Assessment & Plan:   Problem List Items Addressed This Visit     Acute bronchitis    New.  R/o CAP - get a CXR Zpac Hycodan syr prn      Relevant Orders   DG Chest 2 View (Completed)   Essential hypertension    Continue with losartan  Meds ordered this encounter  Medications   HYDROcodone bit-homatropine (HYCODAN) 5-1.5 MG/5ML syrup    Sig: Take 5 mLs by mouth every 6 (six) hours as needed for up to 10 days for cough.    Dispense:  240 mL    Refill:  0   azithromycin (ZITHROMAX Z-PAK) 250 MG tablet    Sig: As directed    Dispense:  6 tablet    Refill:  0      Follow-up: Return for a follow-up visit.  Walker Kehr, MD

## 2021-05-23 NOTE — Assessment & Plan Note (Signed)
New.  R/o CAP - get a CXR Zpac Hycodan syr prn

## 2021-05-23 NOTE — Telephone Encounter (Signed)
Pt called in and he is aware to be here today at 2:40 per Dr. Alain Marion note.  FYI

## 2021-05-23 NOTE — Telephone Encounter (Signed)
C/o URI OK to add today at 2:40 Thx

## 2021-05-23 NOTE — Patient Instructions (Signed)
You can use over-the-counter  "cold" medicines  such as "Afrin" nasal spray for nasal congestion as directed. Use " Delsym" or" Robitussin" cough syrup varietis for cough.  You can use plain "Tylenol" or "Advil" for fever, chills and achyness. Use Halls or Ricola cough drops.

## 2021-05-26 ENCOUNTER — Other Ambulatory Visit: Payer: Self-pay | Admitting: Internal Medicine

## 2021-05-29 ENCOUNTER — Other Ambulatory Visit: Payer: Self-pay

## 2021-05-29 ENCOUNTER — Other Ambulatory Visit (INDEPENDENT_AMBULATORY_CARE_PROVIDER_SITE_OTHER): Payer: Medicare Other

## 2021-05-29 DIAGNOSIS — J209 Acute bronchitis, unspecified: Secondary | ICD-10-CM

## 2021-05-29 DIAGNOSIS — I10 Essential (primary) hypertension: Secondary | ICD-10-CM

## 2021-05-29 LAB — COMPREHENSIVE METABOLIC PANEL
ALT: 37 U/L (ref 0–53)
AST: 36 U/L (ref 0–37)
Albumin: 3.8 g/dL (ref 3.5–5.2)
Alkaline Phosphatase: 71 U/L (ref 39–117)
BUN: 21 mg/dL (ref 6–23)
CO2: 32 mEq/L (ref 19–32)
Calcium: 9.2 mg/dL (ref 8.4–10.5)
Chloride: 104 mEq/L (ref 96–112)
Creatinine, Ser: 1.3 mg/dL (ref 0.40–1.50)
GFR: 50.67 mL/min — ABNORMAL LOW (ref >60.00)
Glucose, Bld: 140 mg/dL — ABNORMAL HIGH (ref 70–99)
Potassium: 3.7 mEq/L (ref 3.5–5.1)
Sodium: 140 mEq/L (ref 135–145)
Total Bilirubin: 0.5 mg/dL (ref 0.2–1.2)
Total Protein: 7.1 g/dL (ref 6.0–8.3)

## 2021-05-29 LAB — CBC WITH DIFFERENTIAL/PLATELET
Basophils Absolute: 0 10*3/uL (ref 0.0–0.1)
Basophils Relative: 0.4 % (ref 0.0–3.0)
Eosinophils Absolute: 0.1 10*3/uL (ref 0.0–0.7)
Eosinophils Relative: 1.2 % (ref 0.0–5.0)
HCT: 39.4 % (ref 39.0–52.0)
Hemoglobin: 13.1 g/dL (ref 13.0–17.0)
Lymphocytes Relative: 18.5 % (ref 12.0–46.0)
Lymphs Abs: 1.7 10*3/uL (ref 0.7–4.0)
MCHC: 33.2 g/dL (ref 30.0–36.0)
MCV: 94.9 fl (ref 78.0–100.0)
Monocytes Absolute: 0.5 10*3/uL (ref 0.1–1.0)
Monocytes Relative: 5.4 % (ref 3.0–12.0)
Neutro Abs: 7 10*3/uL (ref 1.4–7.7)
Neutrophils Relative %: 74.5 % (ref 43.0–77.0)
Platelets: 253 10*3/uL (ref 150.0–400.0)
RBC: 4.16 Mil/uL — ABNORMAL LOW (ref 4.22–5.81)
RDW: 12.7 % (ref 11.5–15.5)
WBC: 9.3 10*3/uL (ref 4.0–10.5)

## 2021-05-29 MED ORDER — LEVOFLOXACIN 500 MG PO TABS: 500.0000 mg | ORAL_TABLET | Freq: Every day | ORAL | 0 refills | Status: DC

## 2021-05-29 NOTE — Telephone Encounter (Signed)
Not better CBC, CMET Levaquin po OV if not well

## 2021-06-12 NOTE — Assessment & Plan Note (Signed)
Continue with losartan 

## 2021-06-19 ENCOUNTER — Encounter: Payer: Self-pay | Admitting: Internal Medicine

## 2021-06-21 ENCOUNTER — Encounter: Payer: Self-pay | Admitting: Internal Medicine

## 2021-06-21 DIAGNOSIS — H9313 Tinnitus, bilateral: Secondary | ICD-10-CM | POA: Diagnosis not present

## 2021-06-21 DIAGNOSIS — H903 Sensorineural hearing loss, bilateral: Secondary | ICD-10-CM | POA: Diagnosis not present

## 2021-06-22 DIAGNOSIS — H918X9 Other specified hearing loss, unspecified ear: Secondary | ICD-10-CM | POA: Insufficient documentation

## 2021-06-22 DIAGNOSIS — H9313 Tinnitus, bilateral: Secondary | ICD-10-CM | POA: Insufficient documentation

## 2021-06-22 DIAGNOSIS — H903 Sensorineural hearing loss, bilateral: Secondary | ICD-10-CM | POA: Insufficient documentation

## 2021-06-22 DIAGNOSIS — Z8739 Personal history of other diseases of the musculoskeletal system and connective tissue: Secondary | ICD-10-CM | POA: Diagnosis not present

## 2021-07-19 DIAGNOSIS — B353 Tinea pedis: Secondary | ICD-10-CM | POA: Diagnosis not present

## 2021-07-24 ENCOUNTER — Encounter: Payer: Medicare Other | Admitting: Internal Medicine

## 2021-07-31 ENCOUNTER — Ambulatory Visit: Payer: Medicare Other

## 2021-08-02 ENCOUNTER — Encounter: Payer: Medicare Other | Admitting: Internal Medicine

## 2021-08-02 DIAGNOSIS — L821 Other seborrheic keratosis: Secondary | ICD-10-CM | POA: Diagnosis not present

## 2021-08-02 DIAGNOSIS — D485 Neoplasm of uncertain behavior of skin: Secondary | ICD-10-CM | POA: Diagnosis not present

## 2021-08-02 DIAGNOSIS — L603 Nail dystrophy: Secondary | ICD-10-CM | POA: Diagnosis not present

## 2021-08-08 ENCOUNTER — Encounter: Payer: Self-pay | Admitting: Internal Medicine

## 2021-08-21 ENCOUNTER — Other Ambulatory Visit: Payer: Self-pay | Admitting: Internal Medicine

## 2021-08-21 NOTE — Telephone Encounter (Signed)
To PCP please ?

## 2021-08-29 ENCOUNTER — Ambulatory Visit (INDEPENDENT_AMBULATORY_CARE_PROVIDER_SITE_OTHER): Payer: Medicare Other | Admitting: Internal Medicine

## 2021-08-29 ENCOUNTER — Encounter: Payer: Self-pay | Admitting: Internal Medicine

## 2021-08-29 VITALS — BP 132/80 | HR 55 | Temp 98.1°F | Ht 69.0 in | Wt 188.0 lb

## 2021-08-29 DIAGNOSIS — M25511 Pain in right shoulder: Secondary | ICD-10-CM

## 2021-08-29 DIAGNOSIS — G8929 Other chronic pain: Secondary | ICD-10-CM

## 2021-08-29 DIAGNOSIS — Z0000 Encounter for general adult medical examination without abnormal findings: Secondary | ICD-10-CM

## 2021-08-29 DIAGNOSIS — M25519 Pain in unspecified shoulder: Secondary | ICD-10-CM | POA: Insufficient documentation

## 2021-08-29 DIAGNOSIS — Z125 Encounter for screening for malignant neoplasm of prostate: Secondary | ICD-10-CM | POA: Diagnosis not present

## 2021-08-29 DIAGNOSIS — I10 Essential (primary) hypertension: Secondary | ICD-10-CM

## 2021-08-29 DIAGNOSIS — R0989 Other specified symptoms and signs involving the circulatory and respiratory systems: Secondary | ICD-10-CM | POA: Diagnosis not present

## 2021-08-29 DIAGNOSIS — Z1211 Encounter for screening for malignant neoplasm of colon: Secondary | ICD-10-CM

## 2021-08-29 LAB — CBC WITH DIFFERENTIAL/PLATELET
Basophils Absolute: 0 10*3/uL (ref 0.0–0.1)
Basophils Relative: 0.5 % (ref 0.0–3.0)
Eosinophils Absolute: 0.2 10*3/uL (ref 0.0–0.7)
Eosinophils Relative: 3.1 % (ref 0.0–5.0)
HCT: 42.8 % (ref 39.0–52.0)
Hemoglobin: 14.6 g/dL (ref 13.0–17.0)
Lymphocytes Relative: 27.4 % (ref 12.0–46.0)
Lymphs Abs: 1.6 10*3/uL (ref 0.7–4.0)
MCHC: 34 g/dL (ref 30.0–36.0)
MCV: 96.4 fl (ref 78.0–100.0)
Monocytes Absolute: 0.5 10*3/uL (ref 0.1–1.0)
Monocytes Relative: 7.9 % (ref 3.0–12.0)
Neutro Abs: 3.5 10*3/uL (ref 1.4–7.7)
Neutrophils Relative %: 61.1 % (ref 43.0–77.0)
Platelets: 173 10*3/uL (ref 150.0–400.0)
RBC: 4.44 Mil/uL (ref 4.22–5.81)
RDW: 14.1 % (ref 11.5–15.5)
WBC: 5.8 10*3/uL (ref 4.0–10.5)

## 2021-08-29 LAB — COMPREHENSIVE METABOLIC PANEL
ALT: 18 U/L (ref 0–53)
AST: 25 U/L (ref 0–37)
Albumin: 4.2 g/dL (ref 3.5–5.2)
Alkaline Phosphatase: 53 U/L (ref 39–117)
BUN: 20 mg/dL (ref 6–23)
CO2: 31 mEq/L (ref 19–32)
Calcium: 9.4 mg/dL (ref 8.4–10.5)
Chloride: 104 mEq/L (ref 96–112)
Creatinine, Ser: 1.18 mg/dL (ref 0.40–1.50)
GFR: 56.82 mL/min — ABNORMAL LOW (ref >60.00)
Glucose, Bld: 96 mg/dL (ref 70–99)
Potassium: 4 mEq/L (ref 3.5–5.1)
Sodium: 142 mEq/L (ref 135–145)
Total Bilirubin: 0.8 mg/dL (ref 0.2–1.2)
Total Protein: 6.4 g/dL (ref 6.0–8.3)

## 2021-08-29 LAB — URINALYSIS
Bilirubin Urine: NEGATIVE
Hgb urine dipstick: NEGATIVE
Ketones, ur: NEGATIVE
Leukocytes,Ua: NEGATIVE
Nitrite: NEGATIVE
Specific Gravity, Urine: <=1.005 — AB (ref 1.000–1.030)
Total Protein, Urine: NEGATIVE
Urine Glucose: NEGATIVE
Urobilinogen, UA: 0.2 (ref 0.0–1.0)
pH: 6 (ref 5.0–8.0)

## 2021-08-29 LAB — LIPID PANEL
Cholesterol: 176 mg/dL (ref 0–200)
HDL: 76.4 mg/dL (ref >39.00)
LDL Cholesterol: 84 mg/dL (ref 0–99)
NonHDL: 99.41
Total CHOL/HDL Ratio: 2
Triglycerides: 76 mg/dL (ref 0.0–149.0)
VLDL: 15.2 mg/dL (ref 0.0–40.0)

## 2021-08-29 LAB — PSA: PSA: 1.26 ng/mL (ref 0.10–4.00)

## 2021-08-29 LAB — TSH: TSH: 2.72 u[IU]/mL (ref 0.35–5.50)

## 2021-08-29 NOTE — Progress Notes (Signed)
Subjective:  Patient ID: [name], male    DOB: 10-May-1935  Age: 86 y.o. MRN: [id number]  CC: No chief complaint on file.   HPI [name] presents for a well exam  Outpatient Medications Prior to Visit  Medication Sig Dispense Refill  . diazepam (VALIUM) 5 MG tablet TAKE ONE TABLET BY MOUTH AT BEDTIME AS NEEDED 90 tablet 1  . diphenhydrAMINE (BENADRYL) 25 MG tablet Take 1 tablet by mouth 3 (three) times daily as needed. 2 by mouth at bedtime     . levofloxacin (LEVAQUIN) 500 MG tablet Take 1 tablet (500 mg total) by mouth daily. 7 tablet 0  . losartan (COZAAR) 25 MG tablet TAKE (2) TABLETS BY MOUTH DAILY. 180 tablet 2  . Magnesium 400 MG CAPS Take by mouth.    . Multiple Vitamin (MULTIVITAMIN) capsule Take 2 capsules by mouth daily.     . Omega-3 1000 MG CAPS Take 1 capsule by mouth daily.    . vitamin C (ASCORBIC ACID) 500 MG tablet Take 500 mg by mouth daily.    Marland Kitchen azithromycin (ZITHROMAX Z-PAK) 250 MG tablet As directed 6 tablet 0   No facility-administered medications prior to visit.    ROS: Review of Systems  Constitutional:  Negative for appetite change, fatigue and unexpected weight change.  HENT:  Negative for congestion, nosebleeds, sneezing, sore throat and trouble swallowing.   Eyes:  Negative for itching and visual disturbance.  Respiratory:  Negative for cough.   Cardiovascular:  Negative for chest pain, palpitations and leg swelling.  Gastrointestinal:  Negative for abdominal distention, blood in stool, diarrhea and nausea.  Genitourinary:  Negative for frequency and hematuria.  Musculoskeletal:  Negative for back pain, gait problem, joint swelling and neck pain.  Skin:  Negative for rash and wound.  Neurological:  Negative for dizziness, tremors, speech difficulty and weakness.  Psychiatric/Behavioral:  Negative for agitation, dysphoric mood and sleep disturbance. The patient is not nervous/anxious.    Objective:  BP 132/80 (BP Location: Left Arm,  Patient Position: Sitting, Cuff Size: Large)   Pulse (!) 55   Temp 98.1 F (36.7 C) (Oral)   Ht '5\' 9"'$  (1.753 m)   Wt 188 lb (85.3 kg)   SpO2 96%   BMI 27.76 kg/m   BP Readings from Last 3 Encounters:  08/29/21 132/80  05/23/21 130/72  11/12/19 126/70    Wt Readings from Last 3 Encounters:  08/29/21 188 lb (85.3 kg)  05/23/21 192 lb (87.1 kg)  11/12/19 187 lb (84.8 kg)    Physical Exam Constitutional:      General: He is not in acute distress.    Appearance: Normal appearance. He is well-developed.     Comments: NAD  Eyes:     Conjunctiva/sclera: Conjunctivae normal.     Pupils: Pupils are equal, round, and reactive to light.  Neck:     Thyroid: No thyromegaly.     Vascular: No JVD.  Cardiovascular:     Rate and Rhythm: Normal rate and regular rhythm.     Heart sounds: Normal heart sounds. No murmur heard.   No friction rub. No gallop.  Pulmonary:     Effort: Pulmonary effort is normal. No respiratory distress.     Breath sounds: Normal breath sounds. No wheezing or rales.  Chest:     Chest wall: No tenderness.  Abdominal:     General: Bowel sounds are normal. There is no distension.     Palpations: Abdomen is soft. There  is no mass.     Tenderness: There is no abdominal tenderness. There is no guarding or rebound.  Musculoskeletal:        General: No tenderness or deformity. Normal range of motion.     Cervical back: Normal range of motion.  Lymphadenopathy:     Cervical: No cervical adenopathy.  Skin:    General: Skin is warm and dry.     Findings: No rash.  Neurological:     Mental Status: He is alert and oriented to person, place, and time.     Cranial Nerves: No cranial nerve deficit.     Motor: No abnormal muscle tone.     Coordination: Coordination normal.     Gait: Gait normal.     Deep Tendon Reflexes: Reflexes are normal and symmetric.  Psychiatric:        Behavior: Behavior normal.        Thought Content: Thought content normal.         Judgment: Judgment normal.    Lab Results  Component Value Date   WBC 9.3 05/29/2021   HGB 13.1 05/29/2021   HCT 39.4 05/29/2021   PLT 253.0 05/29/2021   GLUCOSE 140 (H) 05/29/2021   CHOL 175 02/05/2019   TRIG 57.0 02/05/2019   HDL 70.30 02/05/2019   LDLDIRECT 119.5 09/24/2006   LDLCALC 93 02/05/2019   ALT 37 05/29/2021   AST 36 05/29/2021   NA 140 05/29/2021   K 3.7 05/29/2021   CL 104 05/29/2021   CREATININE 1.30 05/29/2021   BUN 21 05/29/2021   CO2 32 05/29/2021   TSH 2.74 11/12/2019   PSA 1.33 02/05/2019    DG Cervical Spine Complete  Result Date: 05/26/2018 CLINICAL DATA:  Neck pain with decreased range of motion for 2 days, no known injury, initial encounter EXAM: CERVICAL SPINE - COMPLETE 4+ VIEW COMPARISON:  04/12/2009 FINDINGS: There again noted changes consistent with fusion extending from C3-C7 with anterior fixation. Osteophytic changes at C2-3 are noted. No prevertebral soft tissue changes are noted. No hardware failure is seen. The neural foramina show narrowing at 6 7 bilaterally. No soft tissue abnormality is seen. The odontoid is within normal limits. IMPRESSION: Postsurgical and degenerative changes without acute abnormality Electronically Signed   By: Inez Catalina M.D.   On: 05/26/2018 15:25    Assessment & Plan:   Problem List Items Addressed This Visit     Bilateral carotid bruits   Essential hypertension    On Losartan       Well adult exam    We discussed age appropriate health related issues, including available/recomended screening tests and vaccinations. We discussed a need for adhering to healthy diet and exercise. Labs were ordered to be later reviewed . All questions were answered.  B carotids clear 2017 Colonoscopy - last in 2011 Cardiac CT calcium score "0" 2020 Shingrix rx         No orders of the defined types were placed in this encounter.     Follow-up: No follow-ups on file.  Walker Kehr, MD

## 2021-08-29 NOTE — Assessment & Plan Note (Addendum)
We discussed age appropriate health related issues, including available/recomended screening tests and vaccinations. We discussed a need for adhering to healthy diet and exercise. Labs were ordered to be later reviewed . All questions were answered.  B carotids clear 2017 Colonoscopy - last in 2011 Cardiac CT calcium score "0" 2020 Shingrix rx Cologuard offered 2023

## 2021-08-29 NOTE — Patient Instructions (Signed)
Blue-Emu cream -- use 2-3 times a day ? ?

## 2021-08-29 NOTE — Assessment & Plan Note (Signed)
Blue-Emu cream was recommended to use 2-3 times a day ? ?

## 2021-08-29 NOTE — Assessment & Plan Note (Signed)
On Losartan 

## 2021-09-13 DIAGNOSIS — H43813 Vitreous degeneration, bilateral: Secondary | ICD-10-CM | POA: Diagnosis not present

## 2021-09-13 DIAGNOSIS — H5213 Myopia, bilateral: Secondary | ICD-10-CM | POA: Diagnosis not present

## 2021-09-13 DIAGNOSIS — H25013 Cortical age-related cataract, bilateral: Secondary | ICD-10-CM | POA: Diagnosis not present

## 2021-09-13 DIAGNOSIS — H2513 Age-related nuclear cataract, bilateral: Secondary | ICD-10-CM | POA: Diagnosis not present

## 2021-10-04 ENCOUNTER — Other Ambulatory Visit: Payer: Self-pay | Admitting: Internal Medicine

## 2021-11-06 ENCOUNTER — Ambulatory Visit (INDEPENDENT_AMBULATORY_CARE_PROVIDER_SITE_OTHER): Payer: Medicare Other

## 2021-11-06 DIAGNOSIS — Z0000 Encounter for general adult medical examination without abnormal findings: Secondary | ICD-10-CM | POA: Diagnosis not present

## 2021-11-06 NOTE — Progress Notes (Cosign Needed Addendum)
Subjective:   [name] is a 86 y.o. male who presents for Medicare Annual/Subsequent preventive examination.  I connected with [name] today by telephone and verified that I am speaking with the correct person using two identifiers. I discussed the limitations, risks, security and privacy concerns of performing an evaluation and management service by telephone and the availability of in person appointments. I also discussed with the patient that there may be a patient responsible charge related to this service. The patient expressed understanding and agreed to proceed. Location patient: home Location provider: [facility] Persons participating in the visit: [name] and [name], [name].  Time Spent with patient on telephone encounter: 10 minutes  Review of Systems    No ROS. Medicare Wellness Telephone Visit. Additional risk factors are reflected in social history. Cardiac Risk Factors include: advanced age (>69mn, >>68women)     Objective:    There were no vitals filed for this visit. There is no height or weight on file to calculate BMI.     11/06/2021    9:57 AM 12/27/2015    5:45 PM  Advanced Directives  Does Patient Have a Medical Advance Directive? Yes Yes  Type of ACorporate treasurerof AWrightLiving will  Does patient want to make changes to medical advance directive? No - Patient declined No - Patient declined  Copy of [place] Chart?  No - copy requested  Would patient like information on creating a medical advance directive?  No - patient declined information    Current Medications (verified) Outpatient Encounter Medications as of 11/06/2021  Medication Sig   diazepam (VALIUM) 5 MG tablet TAKE ONE TABLET BY MOUTH AT BEDTIME AS NEEDED   diphenhydrAMINE (BENADRYL) 25 MG tablet Take 1 tablet by mouth 3 (three) times daily as needed. 2 by mouth at bedtime    levofloxacin (LEVAQUIN) 500 MG tablet Take 1 tablet (500 mg  total) by mouth daily.   losartan (COZAAR) 25 MG tablet TAKE (2) TABLETS BY MOUTH DAILY.   Magnesium 400 MG CAPS Take by mouth.   Multiple Vitamin (MULTIVITAMIN) capsule Take 2 capsules by mouth daily.    Omega-3 1000 MG CAPS Take 1 capsule by mouth daily.   vitamin C (ASCORBIC ACID) 500 MG tablet Take 500 mg by mouth daily.   No facility-administered encounter medications on file as of 11/06/2021.    Allergies (verified) Amoxicillin   History: Past Medical History:  Diagnosis Date   CHOLELITHIASIS 12/17/2007   COLONIC POLYPS, ADENOMATOUS, HX OF 12/17/2007   DEGENERATIVE DISC DISEASE, CERVICAL SPINE 02/19/2008   DISC DISEASE, LUMBAR 12/17/2007   HYPERTENSION 12/17/2007   INTERNAL HEMORRHOIDS 12/17/2007   Overweight(278.02) 02/19/2008   POLIOMYELITIS, HX OF 12/17/2007   RENAL CYST 12/17/2007   SHINGLES, HX OF 12/17/2007   Past Surgical History:  Procedure Laterality Date   Bilateral knee surgery     arthroscopy bilateral - Dr. WNoemi Chapel  CERVICAL FUSION     HEMORRHOID SURGERY     lumbar back surgery     two procedures: diskectomy and then a fusion L4-5, L5-S1 Dr. NSherwood Gambler  TONSILLECTOMY     Family History  Problem Relation Age of Onset   Cancer Neg Hx    Diabetes Neg Hx    Heart disease Neg Hx    Hyperlipidemia Neg Hx    Social History   Socioeconomic History   Marital status: Married    Spouse name: Not on file  Number of children: 2   Years of education: 16   Highest education level: Not on file  Occupational History   Occupation: [employer] worker [employer].  Tobacco Use   Smoking status: Former    Types: Cigarettes    Quit date: 05/03/2005    Years since quitting: 16.5   Smokeless tobacco: Never  Substance and Sexual Activity   Alcohol use: Yes    Comment: rare drinker   Drug use: No   Sexual activity: Yes    Partners: Female  Other Topics Concern   Not on file  Social History Narrative   HSG, [employer]. Married in  '64, 2 adopted sons: one in business with him, one as Engineer, structural; 2 grandchild. Moved to [place] in 1964. 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 Marriage in [place]. ACP - Yes- CPR; no prolonged intubation; no heroic or futile measure in the face of poor prognosis.    Social Determinants of Health   Financial Resource Strain: Low Risk  (11/06/2021)   Overall Financial Resource Strain (CARDIA)    Difficulty of Paying Living Expenses: Not hard at all  Food Insecurity: No Food Insecurity (11/06/2021)   Hunger Vital Sign    Worried About Running Out of Food in the Last Year: Never true    Ran Out of Food in the Last Year: Never true  Transportation Needs: No Transportation Needs (11/06/2021)   PRAPARE - Hydrologist (Medical): No    Lack of Transportation (Non-Medical): No  Physical Activity: Sufficiently Active (11/06/2021)   Exercise Vital Sign    Days of Exercise per Week: 7 days    Minutes of Exercise per Session: 80 min  Stress: No Stress Concern Present (11/06/2021)   [employer]    Feeling of Stress : Not at all  Social Connections: Moderately Integrated (11/06/2021)   Social Connection and Isolation Panel [NHANES]    Frequency of Communication with Friends and Family: More than three times a week    Frequency of Social Gatherings with Friends and Family: More than three times a week    Attends Religious Services: 1 to 4 times per year    Active Member of [employer] or Organizations: No    Attends Music therapist: Never    Marital Status: Married    Tobacco Counseling Counseling given: Not Answered  Clinical Intake:  Pre-visit preparation completed: Yes  Pain : No/denies pain     Nutritional Risks: None Diabetes: No  How often do you need to have someone help you when you read instructions, pamphlets, or other written materials from your doctor or pharmacy?: 1  - Never What is the last grade level you completed in school?: college degree  Diabetic?no  Interpreter Needed?: No  Information entered by :: [name], Wal[place]   Activities of Daily Living    11/06/2021    9:58 AM  In your present state of health, do you have any difficulty performing the following activities:  Hearing? 0  Vision? 0  Difficulty concentrating or making decisions? 0  Walking or climbing stairs? 0  Dressing or bathing? 0  Doing errands, shopping? 0  Preparing Food and eating ? N  Using the Toilet? N  In the past six months, have you accidently leaked urine? N  Do you have problems with loss of bowel control? N  Managing your Medications? N  Managing your Finances?  N  Housekeeping or managing your Housekeeping? N    Patient Care Team: Plotnikov, Evie Lacks, MD as PCP - General (Internal Medicine) Ralene Bathe, MD as Consulting Physician (Ophthalmology) Elsie Saas, MD as Consulting Physician (Orthopedic Surgery) Jovita Gamma, MD as Consulting Physician (Neurosurgery) Lindwood Coke, MD as Consulting Physician (Dermatology) Lafayette Dragon, MD (Inactive) as Consulting Physician (Gastroenterology)  Indicate any recent Medical Services you may have received from other than [facility] providers in the past year (date may be approximate).     Assessment:   This is a routine wellness examination for [name].  Hearing/Vision screen Patient denied any hearing difficulty. No hearing aids. Patient does not wear any corrective lenses/contacts.   Dietary issues and exercise activities discussed: Current Exercise Habits: Home exercise routine, Type of exercise: Other - see comments (swimming and biking), Time (Minutes): > 60, Frequency (Times/Week): 7, Weekly Exercise (Minutes/Week): 0, Intensity: Moderate, Exercise limited by: None identified   Goals Addressed               This Visit's Progress     Patient Stated (pt-stated)        Patient would like  to continue to stay healthy.       Depression Screen    11/06/2021    9:55 AM 08/29/2021   11:42 AM 05/23/2021    2:44 PM 02/05/2019    9:04 AM 11/27/2017    8:45 AM 11/26/2016    9:14 AM 11/01/2015    2:38 PM  PHQ 2/9 Scores  PHQ - 2 Score 0 0 0 0 0 0 0    Fall Risk    11/06/2021    9:57 AM 08/29/2021   11:42 AM 05/23/2021    2:45 PM 02/05/2019    9:05 AM 11/27/2017    8:45 AM  Fall Risk   Falls in the past year? 0 0 0 0 No  Number falls in past yr: 0 0 0    Injury with Fall? 0 0 0    Risk for fall due to : No Fall Risks No Fall Risks     Follow up Falls evaluation completed Falls evaluation completed  Falls evaluation completed     [place]:  Any stairs in or around the home? No  If so, are there any without handrails?  N/a Home free of loose throw rugs in walkways, pet beds, electrical cords, etc? Yes  Adequate lighting in your home to reduce risk of falls? Yes   ASSISTIVE DEVICES UTILIZED TO PREVENT FALLS:  Life alert? No  Use of a cane, walker or w/c? No  Grab bars in the bathroom? Yes  Shower chair or bench in shower? No  Elevated toilet seat or a handicapped toilet? Yes   TIMED UP AND GO:  Was the test performed? No this was a phone visit.  Length of time to ambulate 10 feet: n/a sec.   Patient stated that he has no issues with gait or balance; does not use any assistive devices.  Cognitive Function:  Patient is cogitatively intact.      11/06/2021    9:59 AM  6CIT Screen  What Year? 0 points  What month? 0 points  What time? 0 points  Count back from 20 0 points  Months in reverse 0 points  Repeat phrase 0 points  Total Score 0 points    Immunizations Immunization History  Administered Date(s) Administered   Fluad Quad(high Dose 65+) 12/06/2018, 01/08/2020, 01/20/2021  Influenza Whole 01/19/2008, 01/18/2009   Influenza, High Dose Seasonal PF 01/17/2016, 11/28/2016, 12/30/2017   Influenza-Unspecified 12/02/2011,  01/30/2014   Moderna Covid-19 Vaccine Bivalent Booster 20yr & up 12/21/2020   Moderna Sars-Covid-2 Vaccination 04/11/2019, 05/09/2019, 08/02/2020   Pneumococcal Conjugate-13 05/06/2013   Pneumococcal Polysaccharide-23 02/01/2010   Td 05/15/2006   Tdap 10/04/2016    TDAP status: Up to date  Flu Vaccine status: Up to date  Pneumococcal vaccine status: Up to date  Covid-19 vaccine status: Completed vaccines  Qualifies for Shingles Vaccine? Yes   Zostavax completed No   Shingrix Completed?: No.    Education has been provided regarding the importance of this vaccine. Patient has been advised to call insurance company to determine out of pocket expense if they have not yet received this vaccine. Advised may also receive vaccine at local pharmacy or Health Dept. Verbalized acceptance and understanding.  Screening Tests Health Maintenance  Topic Date Due   Zoster Vaccines- Shingrix (1 of 2) Never done   INFLUENZA VACCINE  10/31/2021   COVID-19 Vaccine (5 - Moderna series) 11/22/2021 (Originally 04/22/2021)   TETANUS/TDAP  10/05/2026   Pneumonia Vaccine 86 Years old  Completed   HPV VACCINES  Aged Out    Health Maintenance  Health Maintenance Due  Topic Date Due   Zoster Vaccines- Shingrix (1 of 2) Never done   INFLUENZA VACCINE  10/31/2021    Colorectal cancer screening: No longer required.   Lung Cancer Screening: (Low Dose CT Chest recommended if Age 84-80years, 30 pack-year currently smoking OR have quit w/in 15years.) does not qualify.   Lung Cancer Screening Referral: N/A  Additional Screening:  Hepatitis C Screening: does not qualify; Completed N/A  Vision Screening: Recommended annual ophthalmology exams for early detection of glaucoma and other disorders of the eye. Is the patient up to date with their annual eye exam?  Yes  Who is the provider or what is the name of the office in which the patient attends annual eye exams? [facility]  If pt is not  established with a provider, would they like to be referred to a provider to establish care? No .   Dental Screening: Recommended annual dental exams for proper oral hygiene  Community Resource Referral / Chronic Care Management: CRR required this visit?  No   CCM required this visit?  No      Plan:     I have personally reviewed and noted the following in the patient's chart:   Medical and social history Use of alcohol, tobacco or illicit drugs  Current medications and supplements including opioid prescriptions. Patient is not currently taking opioid prescriptions. Functional ability and status Nutritional status Physical activity Advanced directives List of other physicians Hospitalizations, surgeries, and ER visits in previous 12 months Vitals Screenings to include cognitive, depression, and falls Referrals and appointments  In addition, I have reviewed and discussed with patient certain preventive protocols, quality metrics, and best practice recommendations. A written personalized care plan for preventive services as well as general preventive health recommendations were provided to patient.     HRossie Muskrat CMA   11/06/2021    Medical screening examination/treatment/procedure(s) were performed by non-physician practitioner and as supervising physician I was immediately available for consultation/collaboration.  I agree with above. ALew Dawes MD

## 2021-11-14 ENCOUNTER — Other Ambulatory Visit: Payer: Self-pay | Admitting: Internal Medicine

## 2021-11-14 DIAGNOSIS — Z1211 Encounter for screening for malignant neoplasm of colon: Secondary | ICD-10-CM

## 2021-11-14 NOTE — Progress Notes (Deleted)
   Subjective:  Patient ID: [name], male    DOB: 1935-09-07  Age: 86 y.o. MRN: [id number]  CC: No chief complaint on file.   HPI [name] presents for   Outpatient Medications Prior to Visit  Medication Sig Dispense Refill  . diazepam (VALIUM) 5 MG tablet TAKE ONE TABLET BY MOUTH AT BEDTIME AS NEEDED 90 tablet 1  . diphenhydrAMINE (BENADRYL) 25 MG tablet Take 1 tablet by mouth 3 (three) times daily as needed. 2 by mouth at bedtime     . levofloxacin (LEVAQUIN) 500 MG tablet Take 1 tablet (500 mg total) by mouth daily. 7 tablet 0  . losartan (COZAAR) 25 MG tablet TAKE (2) TABLETS BY MOUTH DAILY. 180 tablet 2  . Magnesium 400 MG CAPS Take by mouth.    . Multiple Vitamin (MULTIVITAMIN) capsule Take 2 capsules by mouth daily.     . Omega-3 1000 MG CAPS Take 1 capsule by mouth daily.    . vitamin C (ASCORBIC ACID) 500 MG tablet Take 500 mg by mouth daily.     No facility-administered medications prior to visit.    ROS: Review of Systems  Objective:  There were no vitals taken for this visit.  BP Readings from Last 3 Encounters:  08/29/21 132/80  05/23/21 130/72  11/12/19 126/70    Wt Readings from Last 3 Encounters:  08/29/21 188 lb (85.3 kg)  05/23/21 192 lb (87.1 kg)  11/12/19 187 lb (84.8 kg)    Physical Exam  Lab Results  Component Value Date   WBC 5.8 08/29/2021   HGB 14.6 08/29/2021   HCT 42.8 08/29/2021   PLT 173.0 08/29/2021   GLUCOSE 96 08/29/2021   CHOL 176 08/29/2021   TRIG 76.0 08/29/2021   HDL 76.40 08/29/2021   LDLDIRECT 119.5 09/24/2006   LDLCALC 84 08/29/2021   ALT 18 08/29/2021   AST 25 08/29/2021   NA 142 08/29/2021   K 4.0 08/29/2021   CL 104 08/29/2021   CREATININE 1.18 08/29/2021   BUN 20 08/29/2021   CO2 31 08/29/2021   TSH 2.72 08/29/2021   PSA 1.26 08/29/2021    DG Cervical Spine Complete  Result Date: 05/26/2018 CLINICAL DATA:  Neck pain with decreased range of motion for 2 days, no known injury, initial encounter  EXAM: CERVICAL SPINE - COMPLETE 4+ VIEW COMPARISON:  04/12/2009 FINDINGS: There again noted changes consistent with fusion extending from C3-C7 with anterior fixation. Osteophytic changes at C2-3 are noted. No prevertebral soft tissue changes are noted. No hardware failure is seen. The neural foramina show narrowing at 6 7 bilaterally. No soft tissue abnormality is seen. The odontoid is within normal limits. IMPRESSION: Postsurgical and degenerative changes without acute abnormality Electronically Signed   By: Inez Catalina M.D.   On: 05/26/2018 15:25    Assessment & Plan:   Problem List Items Addressed This Visit   None     No orders of the defined types were placed in this encounter.     Follow-up: No follow-ups on file.  Walker Kehr, MD

## 2022-02-10 ENCOUNTER — Other Ambulatory Visit: Payer: Self-pay | Admitting: Internal Medicine

## 2022-02-13 ENCOUNTER — Other Ambulatory Visit: Payer: Self-pay | Admitting: Internal Medicine

## 2022-02-14 ENCOUNTER — Other Ambulatory Visit: Payer: Self-pay | Admitting: Internal Medicine

## 2022-02-14 ENCOUNTER — Encounter: Payer: Self-pay | Admitting: Internal Medicine

## 2022-02-15 ENCOUNTER — Other Ambulatory Visit: Payer: Self-pay | Admitting: Internal Medicine

## 2022-03-01 ENCOUNTER — Encounter: Payer: Self-pay | Admitting: Internal Medicine

## 2022-03-01 ENCOUNTER — Ambulatory Visit (INDEPENDENT_AMBULATORY_CARE_PROVIDER_SITE_OTHER): Payer: Medicare Other | Admitting: Internal Medicine

## 2022-03-01 VITALS — BP 120/78 | HR 60 | Temp 98.7°F | Ht 69.0 in | Wt 191.0 lb

## 2022-03-01 DIAGNOSIS — G8929 Other chronic pain: Secondary | ICD-10-CM

## 2022-03-01 DIAGNOSIS — F5101 Primary insomnia: Secondary | ICD-10-CM | POA: Diagnosis not present

## 2022-03-01 DIAGNOSIS — M545 Low back pain, unspecified: Secondary | ICD-10-CM

## 2022-03-01 DIAGNOSIS — I10 Essential (primary) hypertension: Secondary | ICD-10-CM | POA: Diagnosis not present

## 2022-03-01 DIAGNOSIS — G4700 Insomnia, unspecified: Secondary | ICD-10-CM | POA: Insufficient documentation

## 2022-03-01 DIAGNOSIS — Z1211 Encounter for screening for malignant neoplasm of colon: Secondary | ICD-10-CM | POA: Diagnosis not present

## 2022-03-01 LAB — COMPREHENSIVE METABOLIC PANEL
ALT: 21 U/L (ref 0–53)
AST: 26 U/L (ref 0–37)
Albumin: 4.1 g/dL (ref 3.5–5.2)
Alkaline Phosphatase: 55 U/L (ref 39–117)
BUN: 26 mg/dL — ABNORMAL HIGH (ref 6–23)
CO2: 32 mEq/L (ref 19–32)
Calcium: 9.5 mg/dL (ref 8.4–10.5)
Chloride: 104 mEq/L (ref 96–112)
Creatinine, Ser: 1.31 mg/dL (ref 0.40–1.50)
GFR: 49.94 mL/min — ABNORMAL LOW (ref >60.00)
Glucose, Bld: 99 mg/dL (ref 70–99)
Potassium: 4.1 mEq/L (ref 3.5–5.1)
Sodium: 142 mEq/L (ref 135–145)
Total Bilirubin: 0.7 mg/dL (ref 0.2–1.2)
Total Protein: 7 g/dL (ref 6.0–8.3)

## 2022-03-01 LAB — CBC WITH DIFFERENTIAL/PLATELET
Basophils Absolute: 0 10*3/uL (ref 0.0–0.1)
Basophils Relative: 0.9 % (ref 0.0–3.0)
Eosinophils Absolute: 0.3 10*3/uL (ref 0.0–0.7)
Eosinophils Relative: 5.7 % — ABNORMAL HIGH (ref 0.0–5.0)
HCT: 44.9 % (ref 39.0–52.0)
Hemoglobin: 15.2 g/dL (ref 13.0–17.0)
Lymphocytes Relative: 33.3 % (ref 12.0–46.0)
Lymphs Abs: 1.9 10*3/uL (ref 0.7–4.0)
MCHC: 33.9 g/dL (ref 30.0–36.0)
MCV: 96.3 fl (ref 78.0–100.0)
Monocytes Absolute: 0.5 10*3/uL (ref 0.1–1.0)
Monocytes Relative: 9.3 % (ref 3.0–12.0)
Neutro Abs: 2.8 10*3/uL (ref 1.4–7.7)
Neutrophils Relative %: 50.8 % (ref 43.0–77.0)
Platelets: 198 10*3/uL (ref 150.0–400.0)
RBC: 4.66 Mil/uL (ref 4.22–5.81)
RDW: 13.5 % (ref 11.5–15.5)
WBC: 5.6 10*3/uL (ref 4.0–10.5)

## 2022-03-01 LAB — TSH: TSH: 3.23 u[IU]/mL (ref 0.35–5.50)

## 2022-03-01 MED ORDER — DIAZEPAM 5 MG PO TABS: 5.0000 mg | ORAL_TABLET | Freq: Every evening | ORAL | 1 refills | Status: DC | PRN

## 2022-03-01 NOTE — Progress Notes (Signed)
Subjective:  Patient ID: [name], male    DOB: 1936/03/29  Age: 86 y.o. MRN: [id number]  CC: Follow-up   HPI [name] presents for insomnia, HTN, shoulder pain  Outpatient Medications Prior to Visit  Medication Sig Dispense Refill   diphenhydrAMINE (BENADRYL) 25 MG tablet Take 1 tablet by mouth 3 (three) times daily as needed. 2 by mouth at bedtime      losartan (COZAAR) 25 MG tablet TAKE (2) TABLETS BY MOUTH DAILY. 180 tablet 2   Magnesium 400 MG CAPS Take by mouth.     Multiple Vitamin (MULTIVITAMIN) capsule Take 2 capsules by mouth daily.      Omega-3 1000 MG CAPS Take 1 capsule by mouth daily.     vitamin C (ASCORBIC ACID) 500 MG tablet Take 500 mg by mouth daily.     diazepam (VALIUM) 5 MG tablet TAKE ONE TABLET BY MOUTH AT BEDTIME AS NEEDED 90 tablet 1   levofloxacin (LEVAQUIN) 500 MG tablet Take 1 tablet (500 mg total) by mouth daily. 7 tablet 0   No facility-administered medications prior to visit.    ROS: Review of Systems  Constitutional:  Negative for appetite change, fatigue and unexpected weight change.  HENT:  Negative for congestion, nosebleeds, sneezing, sore throat and trouble swallowing.   Eyes:  Negative for itching and visual disturbance.  Respiratory:  Negative for cough.   Cardiovascular:  Negative for chest pain, palpitations and leg swelling.  Gastrointestinal:  Negative for abdominal distention, blood in stool, diarrhea and nausea.  Genitourinary:  Negative for frequency and hematuria.  Musculoskeletal:  Negative for back pain, gait problem, joint swelling and neck pain.  Skin:  Negative for rash.  Neurological:  Negative for dizziness, tremors, speech difficulty and weakness.  Psychiatric/Behavioral:  Positive for sleep disturbance. Negative for agitation, dysphoric mood and suicidal ideas. The patient is not nervous/anxious.     Objective:  BP 120/78 (BP Location: Left Arm, Patient Position: Sitting, Cuff Size: Normal)   Pulse 60    Temp 98.7 F (37.1 C) (Oral)   Ht '5\' 9"'$  (1.753 m)   Wt 191 lb (86.6 kg)   SpO2 98%   BMI 28.21 kg/m   BP Readings from Last 3 Encounters:  03/01/22 120/78  08/29/21 132/80  05/23/21 130/72    Wt Readings from Last 3 Encounters:  03/01/22 191 lb (86.6 kg)  08/29/21 188 lb (85.3 kg)  05/23/21 192 lb (87.1 kg)    Physical Exam Constitutional:      General: He is not in acute distress.    Appearance: He is well-developed.     Comments: NAD  Eyes:     Conjunctiva/sclera: Conjunctivae normal.     Pupils: Pupils are equal, round, and reactive to light.  Neck:     Thyroid: No thyromegaly.     Vascular: No JVD.  Cardiovascular:     Rate and Rhythm: Normal rate and regular rhythm.     Heart sounds: Normal heart sounds. No murmur heard.    No friction rub. No gallop.  Pulmonary:     Effort: Pulmonary effort is normal. No respiratory distress.     Breath sounds: Normal breath sounds. No wheezing or rales.  Chest:     Chest wall: No tenderness.  Abdominal:     General: Bowel sounds are normal. There is no distension.     Palpations: Abdomen is soft. There is no mass.     Tenderness: There is no abdominal tenderness. There is  no guarding or rebound.  Musculoskeletal:        General: No tenderness. Normal range of motion.     Cervical back: Normal range of motion.  Lymphadenopathy:     Cervical: No cervical adenopathy.  Skin:    General: Skin is warm and dry.     Findings: No rash.  Neurological:     Mental Status: He is alert and oriented to person, place, and time.     Cranial Nerves: No cranial nerve deficit.     Motor: No abnormal muscle tone.     Coordination: Coordination normal.     Gait: Gait normal.     Deep Tendon Reflexes: Reflexes are normal and symmetric.  Psychiatric:        Behavior: Behavior normal.        Thought Content: Thought content normal.        Judgment: Judgment normal.     Lab Results  Component Value Date   WBC 5.8 08/29/2021   HGB  14.6 08/29/2021   HCT 42.8 08/29/2021   PLT 173.0 08/29/2021   GLUCOSE 96 08/29/2021   CHOL 176 08/29/2021   TRIG 76.0 08/29/2021   HDL 76.40 08/29/2021   LDLDIRECT 119.5 09/24/2006   LDLCALC 84 08/29/2021   ALT 18 08/29/2021   AST 25 08/29/2021   NA 142 08/29/2021   K 4.0 08/29/2021   CL 104 08/29/2021   CREATININE 1.18 08/29/2021   BUN 20 08/29/2021   CO2 31 08/29/2021   TSH 2.72 08/29/2021   PSA 1.26 08/29/2021    DG Cervical Spine Complete  Result Date: 05/26/2018 CLINICAL DATA:  Neck pain with decreased range of motion for 2 days, no known injury, initial encounter EXAM: CERVICAL SPINE - COMPLETE 4+ VIEW COMPARISON:  04/12/2009 FINDINGS: There again noted changes consistent with fusion extending from C3-C7 with anterior fixation. Osteophytic changes at C2-3 are noted. No prevertebral soft tissue changes are noted. No hardware failure is seen. The neural foramina show narrowing at 6 7 bilaterally. No soft tissue abnormality is seen. The odontoid is within normal limits. IMPRESSION: Postsurgical and degenerative changes without acute abnormality Electronically Signed   By: Inez Catalina M.D.   On: 05/26/2018 15:25    Assessment & Plan:   Problem List Items Addressed This Visit     Essential hypertension    On Losartan      Insomnia    On Diazepam prn  Potential benefits of a long term benzodiazepines  use as well as potential risks  and complications were explained to the patient and were aknowledged.       Low back pain    Doing well      Other Visit Diagnoses     Colon cancer screening    -  Primary   Relevant Orders   Cologuard         Meds ordered this encounter  Medications   diazepam (VALIUM) 5 MG tablet    Sig: Take 1 tablet (5 mg total) by mouth at bedtime as needed.    Dispense:  90 tablet    Refill:  1      Follow-up: Return in about 4 months (around 06/30/2022) for a follow-up visit.  Walker Kehr, MD

## 2022-03-01 NOTE — Assessment & Plan Note (Signed)
On Diazepam prn  Potential benefits of a long term benzodiazepines  use as well as potential risks  and complications were explained to the patient and were aknowledged.   

## 2022-03-01 NOTE — Assessment & Plan Note (Signed)
Doing well 

## 2022-03-01 NOTE — Assessment & Plan Note (Signed)
On Losartan 

## 2022-03-02 ENCOUNTER — Encounter: Payer: Self-pay | Admitting: Internal Medicine

## 2022-03-02 DIAGNOSIS — N182 Chronic kidney disease, stage 2 (mild): Secondary | ICD-10-CM | POA: Insufficient documentation

## 2022-05-11 ENCOUNTER — Encounter: Payer: Self-pay | Admitting: Internal Medicine

## 2022-05-11 DIAGNOSIS — Z1211 Encounter for screening for malignant neoplasm of colon: Secondary | ICD-10-CM

## 2022-05-30 DIAGNOSIS — Z1211 Encounter for screening for malignant neoplasm of colon: Secondary | ICD-10-CM | POA: Diagnosis not present

## 2022-06-08 LAB — COLOGUARD: COLOGUARD: NEGATIVE

## 2022-06-13 DIAGNOSIS — H25013 Cortical age-related cataract, bilateral: Secondary | ICD-10-CM | POA: Diagnosis not present

## 2022-06-13 DIAGNOSIS — H1789 Other corneal scars and opacities: Secondary | ICD-10-CM | POA: Diagnosis not present

## 2022-06-13 DIAGNOSIS — H43813 Vitreous degeneration, bilateral: Secondary | ICD-10-CM | POA: Diagnosis not present

## 2022-06-13 DIAGNOSIS — H524 Presbyopia: Secondary | ICD-10-CM | POA: Diagnosis not present

## 2022-06-13 DIAGNOSIS — H5213 Myopia, bilateral: Secondary | ICD-10-CM | POA: Diagnosis not present

## 2022-06-13 DIAGNOSIS — H2513 Age-related nuclear cataract, bilateral: Secondary | ICD-10-CM | POA: Diagnosis not present

## 2022-06-13 DIAGNOSIS — H52203 Unspecified astigmatism, bilateral: Secondary | ICD-10-CM | POA: Diagnosis not present

## 2022-06-19 ENCOUNTER — Other Ambulatory Visit: Payer: Self-pay | Admitting: Internal Medicine

## 2022-06-28 DIAGNOSIS — H918X3 Other specified hearing loss, bilateral: Secondary | ICD-10-CM | POA: Diagnosis not present

## 2022-06-28 DIAGNOSIS — H9313 Tinnitus, bilateral: Secondary | ICD-10-CM | POA: Diagnosis not present

## 2022-06-28 DIAGNOSIS — Z01118 Encounter for examination of ears and hearing with other abnormal findings: Secondary | ICD-10-CM | POA: Diagnosis not present

## 2022-06-28 DIAGNOSIS — H903 Sensorineural hearing loss, bilateral: Secondary | ICD-10-CM | POA: Diagnosis not present

## 2022-07-23 DIAGNOSIS — L84 Corns and callosities: Secondary | ICD-10-CM | POA: Diagnosis not present

## 2022-08-07 DIAGNOSIS — M25511 Pain in right shoulder: Secondary | ICD-10-CM | POA: Diagnosis not present

## 2022-08-07 DIAGNOSIS — M25512 Pain in left shoulder: Secondary | ICD-10-CM | POA: Diagnosis not present

## 2022-08-25 ENCOUNTER — Other Ambulatory Visit: Payer: Self-pay | Admitting: Internal Medicine

## 2022-08-28 NOTE — Telephone Encounter (Signed)
MD out of the office until June 4th pls advise...Raechel Chute

## 2022-08-30 ENCOUNTER — Ambulatory Visit: Payer: Medicare Other | Admitting: Internal Medicine

## 2022-09-11 DIAGNOSIS — M25511 Pain in right shoulder: Secondary | ICD-10-CM | POA: Diagnosis not present

## 2022-09-11 DIAGNOSIS — M25512 Pain in left shoulder: Secondary | ICD-10-CM | POA: Diagnosis not present

## 2022-09-12 ENCOUNTER — Other Ambulatory Visit: Payer: Self-pay | Admitting: Internal Medicine

## 2022-09-17 ENCOUNTER — Encounter: Payer: Self-pay | Admitting: Internal Medicine

## 2022-09-17 ENCOUNTER — Ambulatory Visit (INDEPENDENT_AMBULATORY_CARE_PROVIDER_SITE_OTHER): Payer: Medicare Other | Admitting: Internal Medicine

## 2022-09-17 VITALS — BP 140/78 | HR 65 | Temp 98.2°F | Ht 69.0 in | Wt 192.0 lb

## 2022-09-17 DIAGNOSIS — N182 Chronic kidney disease, stage 2 (mild): Secondary | ICD-10-CM

## 2022-09-17 DIAGNOSIS — I10 Essential (primary) hypertension: Secondary | ICD-10-CM | POA: Diagnosis not present

## 2022-09-17 DIAGNOSIS — F5101 Primary insomnia: Secondary | ICD-10-CM | POA: Diagnosis not present

## 2022-09-17 LAB — CBC WITH DIFFERENTIAL/PLATELET
Basophils Absolute: 0 10*3/uL (ref 0.0–0.1)
Basophils Relative: 0.6 % (ref 0.0–3.0)
Eosinophils Absolute: 0.1 10*3/uL (ref 0.0–0.7)
Eosinophils Relative: 1.8 % (ref 0.0–5.0)
HCT: 46.3 % (ref 39.0–52.0)
Hemoglobin: 15.3 g/dL (ref 13.0–17.0)
Lymphocytes Relative: 32 % (ref 12.0–46.0)
Lymphs Abs: 1.9 10*3/uL (ref 0.7–4.0)
MCHC: 33 g/dL (ref 30.0–36.0)
MCV: 97.2 fl (ref 78.0–100.0)
Monocytes Absolute: 0.5 10*3/uL (ref 0.1–1.0)
Monocytes Relative: 8.1 % (ref 3.0–12.0)
Neutro Abs: 3.4 10*3/uL (ref 1.4–7.7)
Neutrophils Relative %: 57.5 % (ref 43.0–77.0)
Platelets: 208 10*3/uL (ref 150.0–400.0)
RBC: 4.77 Mil/uL (ref 4.22–5.81)
RDW: 14.3 % (ref 11.5–15.5)
WBC: 6 10*3/uL (ref 4.0–10.5)

## 2022-09-17 LAB — COMPREHENSIVE METABOLIC PANEL
ALT: 26 U/L (ref 0–53)
AST: 26 U/L (ref 0–37)
Albumin: 4.1 g/dL (ref 3.5–5.2)
Alkaline Phosphatase: 59 U/L (ref 39–117)
BUN: 22 mg/dL (ref 6–23)
CO2: 31 mEq/L (ref 19–32)
Calcium: 9.6 mg/dL (ref 8.4–10.5)
Chloride: 102 mEq/L (ref 96–112)
Creatinine, Ser: 1.3 mg/dL (ref 0.40–1.50)
GFR: 50.21 mL/min — ABNORMAL LOW (ref >60.00)
Glucose, Bld: 81 mg/dL (ref 70–99)
Potassium: 4.3 mEq/L (ref 3.5–5.1)
Sodium: 139 mEq/L (ref 135–145)
Total Bilirubin: 0.6 mg/dL (ref 0.2–1.2)
Total Protein: 7.3 g/dL (ref 6.0–8.3)

## 2022-09-17 LAB — URINALYSIS
Bilirubin Urine: NEGATIVE
Hgb urine dipstick: NEGATIVE
Ketones, ur: NEGATIVE
Leukocytes,Ua: NEGATIVE
Nitrite: NEGATIVE
Specific Gravity, Urine: 1.02 (ref 1.000–1.030)
Total Protein, Urine: NEGATIVE
Urine Glucose: NEGATIVE
Urobilinogen, UA: 0.2 (ref 0.0–1.0)
pH: 6 (ref 5.0–8.0)

## 2022-09-17 LAB — TSH: TSH: 2.82 u[IU]/mL (ref 0.35–5.50)

## 2022-09-17 LAB — LIPID PANEL
Cholesterol: 207 mg/dL — ABNORMAL HIGH (ref 0–200)
HDL: 80.9 mg/dL (ref >39.00)
LDL Cholesterol: 112 mg/dL — ABNORMAL HIGH (ref 0–99)
NonHDL: 126.04
Total CHOL/HDL Ratio: 3
Triglycerides: 69 mg/dL (ref 0.0–149.0)
VLDL: 13.8 mg/dL (ref 0.0–40.0)

## 2022-09-17 MED ORDER — LOSARTAN POTASSIUM 25 MG PO TABS: 50.0000 mg | ORAL_TABLET | Freq: Every day | ORAL | 3 refills | Status: DC

## 2022-09-17 NOTE — Assessment & Plan Note (Signed)
On Diazepam prn  Potential benefits of a long term benzodiazepines  use as well as potential risks  and complications were explained to the patient and were aknowledged.  

## 2022-09-17 NOTE — Assessment & Plan Note (Signed)
cardiac CT scan for calcium scoring 0

## 2022-09-17 NOTE — Progress Notes (Signed)
Subjective:  Patient ID: [name], male    DOB: 1936/03/27  Age: 87 y.o. MRN: [id number]  CC: Follow-up (6 MNTH F/U)   HPI [name] presents for HTN, allergies  Insert SmartText   Outpatient Medications Prior to Visit  Medication Sig Dispense Refill   diazepam (VALIUM) 5 MG tablet TAKE ONE TABLET BY MOUTH AT BEDTIME AS NEEDED 90 tablet 0   diphenhydrAMINE (BENADRYL) 25 MG tablet Take 1 tablet by mouth 3 (three) times daily as needed. 2 by mouth at bedtime      Magnesium 400 MG CAPS Take by mouth.     Multiple Vitamin (MULTIVITAMIN) capsule Take 2 capsules by mouth daily.      Omega-3 1000 MG CAPS Take 1 capsule by mouth daily.     vitamin C (ASCORBIC ACID) 500 MG tablet Take 500 mg by mouth daily.     losartan (COZAAR) 25 MG tablet TAKE TWO TABLETS BY MOUTH DAILY 180 tablet 0   No facility-administered medications prior to visit.    ROS: Review of Systems  Constitutional:  Negative for appetite change, fatigue and unexpected weight change.  HENT:  Negative for congestion, nosebleeds, sneezing, sore throat and trouble swallowing.   Eyes:  Negative for itching and visual disturbance.  Respiratory:  Negative for cough.   Cardiovascular:  Negative for chest pain, palpitations and leg swelling.  Gastrointestinal:  Negative for abdominal distention, blood in stool, diarrhea and nausea.  Genitourinary:  Negative for frequency and hematuria.  Musculoskeletal:  Negative for back pain, gait problem, joint swelling and neck pain.  Skin:  Negative for rash.  Neurological:  Negative for dizziness, tremors, speech difficulty and weakness.  Psychiatric/Behavioral:  Negative for agitation, dysphoric mood, sleep disturbance and suicidal ideas. The patient is not nervous/anxious.     Objective:  BP (!) 140/78 (BP Location: Left Arm, Patient Position: Sitting, Cuff Size: Large)   Pulse 65   Temp 98.2 F (36.8 C) (Oral)   Ht 5\' 9"  (1.753 m)   Wt 192 lb (87.1 kg)   SpO2 98%    BMI 28.35 kg/m   BP Readings from Last 3 Encounters:  09/17/22 (!) 140/78  03/01/22 120/78  08/29/21 132/80    Wt Readings from Last 3 Encounters:  09/17/22 192 lb (87.1 kg)  03/01/22 191 lb (86.6 kg)  08/29/21 188 lb (85.3 kg)    Physical Exam Constitutional:      General: He is not in acute distress.    Appearance: Normal appearance. He is well-developed.     Comments: NAD  Eyes:     Conjunctiva/sclera: Conjunctivae normal.     Pupils: Pupils are equal, round, and reactive to light.  Neck:     Thyroid: No thyromegaly.     Vascular: No JVD.  Cardiovascular:     Rate and Rhythm: Normal rate and regular rhythm.     Heart sounds: Normal heart sounds. No murmur heard.    No friction rub. No gallop.  Pulmonary:     Effort: Pulmonary effort is normal. No respiratory distress.     Breath sounds: Normal breath sounds. No wheezing or rales.  Chest:     Chest wall: No tenderness.  Abdominal:     General: Bowel sounds are normal. There is no distension.     Palpations: Abdomen is soft. There is no mass.     Tenderness: There is no abdominal tenderness. There is no guarding or rebound.  Musculoskeletal:  General: No tenderness. Normal range of motion.     Cervical back: Normal range of motion.  Lymphadenopathy:     Cervical: No cervical adenopathy.  Skin:    General: Skin is warm and dry.     Findings: No rash.  Neurological:     Mental Status: He is alert and oriented to person, place, and time.     Cranial Nerves: No cranial nerve deficit.     Motor: No abnormal muscle tone.     Coordination: Coordination normal.     Gait: Gait normal.     Deep Tendon Reflexes: Reflexes are normal and symmetric.  Psychiatric:        Behavior: Behavior normal.        Thought Content: Thought content normal.        Judgment: Judgment normal.     Lab Results  Component Value Date   WBC 5.6 03/01/2022   HGB 15.2 03/01/2022   HCT 44.9 03/01/2022   PLT 198.0 03/01/2022    GLUCOSE 99 03/01/2022   CHOL 176 08/29/2021   TRIG 76.0 08/29/2021   HDL 76.40 08/29/2021   LDLDIRECT 119.5 09/24/2006   LDLCALC 84 08/29/2021   ALT 21 03/01/2022   AST 26 03/01/2022   NA 142 03/01/2022   K 4.1 03/01/2022   CL 104 03/01/2022   CREATININE 1.31 03/01/2022   BUN 26 (H) 03/01/2022   CO2 32 03/01/2022   TSH 3.23 03/01/2022   PSA 1.26 08/29/2021    DG Cervical Spine Complete  Result Date: 05/26/2018 CLINICAL DATA:  Neck pain with decreased range of motion for 2 days, no known injury, initial encounter EXAM: CERVICAL SPINE - COMPLETE 4+ VIEW COMPARISON:  04/12/2009 FINDINGS: There again noted changes consistent with fusion extending from C3-C7 with anterior fixation. Osteophytic changes at C2-3 are noted. No prevertebral soft tissue changes are noted. No hardware failure is seen. The neural foramina show narrowing at 6 7 bilaterally. No soft tissue abnormality is seen. The odontoid is within normal limits. IMPRESSION: Postsurgical and degenerative changes without acute abnormality Electronically Signed   By: Alcide Clever M.D.   On: 05/26/2018 15:25    Assessment & Plan:   Problem List Items Addressed This Visit     Essential hypertension    cardiac CT scan for calcium scoring 0      Relevant Medications   losartan (COZAAR) 25 MG tablet   Insomnia    On Diazepam prn  Potential benefits of a long term benzodiazepines  use as well as potential risks  and complications were explained to the patient and were aknowledged.      Chronic renal insufficiency, stage 2 (mild) - Primary    GFR 49- 56; stage IIIa Continue with good hydration         Meds ordered this encounter  Medications   losartan (COZAAR) 25 MG tablet    Sig: Take 2 tablets (50 mg total) by mouth daily.    Dispense:  180 tablet    Refill:  3      Follow-up: Return in about 3 months (around 12/18/2022) for a follow-up visit.  Sonda Primes, MD

## 2022-09-17 NOTE — Assessment & Plan Note (Signed)
GFR 49- 56; stage IIIa Continue with good hydration

## 2022-11-13 DIAGNOSIS — M25511 Pain in right shoulder: Secondary | ICD-10-CM | POA: Diagnosis not present

## 2022-11-13 DIAGNOSIS — M25512 Pain in left shoulder: Secondary | ICD-10-CM | POA: Diagnosis not present

## 2022-11-23 ENCOUNTER — Other Ambulatory Visit: Payer: Self-pay | Admitting: Internal Medicine

## 2022-12-23 ENCOUNTER — Other Ambulatory Visit: Payer: Self-pay | Admitting: Internal Medicine

## 2023-01-01 ENCOUNTER — Ambulatory Visit: Payer: Medicare Other | Admitting: Sports Medicine

## 2023-01-01 VITALS — BP 124/86 | Ht 69.0 in | Wt 180.0 lb

## 2023-01-01 DIAGNOSIS — M19012 Primary osteoarthritis, left shoulder: Secondary | ICD-10-CM | POA: Diagnosis not present

## 2023-01-01 NOTE — Assessment & Plan Note (Signed)
Functionally the patient does very well and he has adequate range of motion that I do not think he is really a surgical candidate.  I discussed with him some pain could still radiate from his neck who is had an extensive fusion.  He does get some relief with Celebrex at night and can continue this.  Today we decided to try ESWT to his left shoulder to see if it gives a benefit.  If this improves his motion, stiffness and pain we should try a series of at least 4-6 treatments.  We could also consider doing this for the right shoulder if the symptoms are bad enough to warrant this treatment.  ESWT Power level 90 mJ Impulses 2000 Head size large Shockwave applied to the glenohumeral joint area on the left Patient tolerated this well without more than minor pain  At conclusion of treatment patient had less crepitation and some improvement in his range of motion.  He plans to see if he gets some response to this and if so we may give him a treatment trial knowing that this is primarily symptomatic treatment and does not change the arthritis status.

## 2023-01-01 NOTE — Progress Notes (Signed)
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  He has done years of taekwondo.  He stays fit and tries to exercise most days. He has been having persistent shoulder pain bilaterally for a number of years.  This is worse on the left where he also hears and feels some grinding. About 15 years ago he had an extensive neck fusion and before that time he had some radiating pain into his shoulders. He also has had lumbar spine disease.  He has been seeing Dr. Everardo Pacific and getting some periodic shoulder injections.  These do relieve his pain but usually only for 4 to 6 weeks.  Now he has trouble with pain in his shoulders waking him up at night.  He does get some relief with oral medicines but wonders if there are other treatment options.  Physical exam Older male who looks much younger than his stated age BP 124/86   Ht 5\' 9"  (1.753 m)   Wt 180 lb (81.6 kg)   BMI 26.58 kg/m   Shoulder forward flexion lacks about 10 degrees from being full Shoulder abduction and elevation is about 20 degrees from being full He has good back scratch bilaterally He has good internal rotation On the left glenohumeral joint there is some mild crepitation with motion Strength testing is excellent and he gets no pain with standard impingement tests

## 2023-01-09 ENCOUNTER — Encounter: Payer: Self-pay | Admitting: Family Medicine

## 2023-01-09 ENCOUNTER — Ambulatory Visit (INDEPENDENT_AMBULATORY_CARE_PROVIDER_SITE_OTHER): Payer: Self-pay | Admitting: Family Medicine

## 2023-01-09 DIAGNOSIS — M19012 Primary osteoarthritis, left shoulder: Secondary | ICD-10-CM

## 2023-01-09 NOTE — Assessment & Plan Note (Signed)
-   Pt received some benefit from the last treatment. Repeated today as below. We will follow up in 1 wk for repeat treatment.   Procedure: ECSWT Indications:  Pain in left shoulder w/ glenohumeral joint osteoarthritis   Procedure Details Consent: Risks of procedure as well as the alternatives and risks of each were explained to the patient.  Written consent for procedure obtained. Time Out: Verified patient identification, verified procedure, site was marked, verified correct patient position, medications/allergies/relevent history reviewed.  The area was cleaned with alcohol swab.     The L glenohumeral joint was targeted for Extracorporeal shockwave therapy.    Preset: shoulder Power Level: 100 MJ Frequency: 12 Hz Impulse/cycles: 2000 Head size: medium   Patient tolerated procedure well without immediate complications.

## 2023-01-09 NOTE — Patient Instructions (Signed)
You just received shock with therapy You may experience some soreness afterwards. Continue gentle range of motion exercises at home along with your regular therapies We will see you next week for the third session

## 2023-01-09 NOTE — Progress Notes (Signed)
LAURENT BRAXTON - 87 y.o. male MRN [id number]  Date of birth: 1935/09/09  PCP: Plotnikov, Georgina Quint, MD  Subjective:  No chief complaint on file.  L shoulder pain  HPI: Past Medical, Surgical, Social, and Family History Reviewed & Updated per EMR.   Patient is a 87 y.o. male here for follow up on OA of the left GH joint, last seen on 01/01/2023. The patient responded well to the therapy.   Past Medical History:  Diagnosis Date   CHOLELITHIASIS 12/17/2007   COLONIC POLYPS, ADENOMATOUS, HX OF 12/17/2007   DEGENERATIVE DISC DISEASE, CERVICAL SPINE 02/19/2008   DISC DISEASE, LUMBAR 12/17/2007   HYPERTENSION 12/17/2007   INTERNAL HEMORRHOIDS 12/17/2007   Overweight(278.02) 02/19/2008   POLIOMYELITIS, HX OF 12/17/2007   RENAL CYST 12/17/2007   SHINGLES, HX OF 12/17/2007    Current Outpatient Medications on File Prior to Visit  Medication Sig Dispense Refill   diazepam (VALIUM) 5 MG tablet TAKE ONE TABLET BY MOUTH AT BEDTIME AS NEEDED 30 tablet 5   diphenhydrAMINE (BENADRYL) 25 MG tablet Take 1 tablet by mouth 3 (three) times daily as needed. 2 by mouth at bedtime      losartan (COZAAR) 25 MG tablet Take 2 tablets (50 mg total) by mouth daily. 180 tablet 3   Magnesium 400 MG CAPS Take by mouth.     Multiple Vitamin (MULTIVITAMIN) capsule Take 2 capsules by mouth daily.      Omega-3 1000 MG CAPS Take 1 capsule by mouth daily.     vitamin C (ASCORBIC ACID) 500 MG tablet Take 500 mg by mouth daily.     No current facility-administered medications on file prior to visit.    Past Surgical History:  Procedure Laterality Date   Bilateral knee surgery     arthroscopy bilateral - Dr. Thurston Hole   CERVICAL FUSION     HEMORRHOID SURGERY     lumbar back surgery     two procedures: diskectomy and then a fusion L4-5, L5-S1 Dr. Newell Coral   TONSILLECTOMY      Allergies  Allergen Reactions   Amoxicillin Swelling and Rash    Has patient had a PCN reaction causing immediate rash, facial/tongue/throat  swelling, SOB or lightheadedness with hypotension: No Has patient had a PCN reaction causing severe rash involving mucus membranes or skin necrosis: No Has patient had a PCN reaction that required hospitalization No Has patient had a PCN reaction occurring within the last 10 years: No If all of the above answers are "NO", then may proceed with Cephalosporin use.        Objective:  Physical Exam: VS: BP:   HR: bpm  TEMP: ( )  RESP:   HT:    WT:   BMI:   Gen: NAD, speaks clearly, comfortable in exam room Respiratory: Normal respiratory effort on room air. No signs of distress Skin: No rashes, abrasions, or ecchymosis MSK: Inspection of the left shoulder shows no deformities, erythema, or edema Range of motion intact but painful arc present No tenderness to palpation over the acromion or posterior joint line    Assessment & Plan:   Osteoarthritis of glenohumeral joint, left - Pt received some benefit from the last treatment. Repeated today as below. We will follow up in 1 wk for repeat treatment.   Procedure: ECSWT Indications:  Pain in left shoulder w/ glenohumeral joint osteoarthritis   Procedure Details Consent: Risks of procedure as well as the alternatives and risks of each were explained to the patient.  Written consent for procedure obtained. Time Out: Verified patient identification, verified procedure, site was marked, verified correct patient position, medications/allergies/relevent history reviewed.  The area was cleaned with alcohol swab.     The L glenohumeral joint was targeted for Extracorporeal shockwave therapy.    Preset: shoulder Power Level: 100 MJ Frequency: 12 Hz Impulse/cycles: 2000 Head size: medium   Patient tolerated procedure well without immediate complications.        Rica Mote MD [facility] Fellow   Addendum:  Patient seen in the office by fellow.  History, exam, plan of care were precepted with me.  Darene Lamer, DO, CAQSM

## 2023-01-16 ENCOUNTER — Encounter: Payer: Self-pay | Admitting: Family Medicine

## 2023-01-16 ENCOUNTER — Ambulatory Visit (INDEPENDENT_AMBULATORY_CARE_PROVIDER_SITE_OTHER): Payer: Self-pay | Admitting: Family Medicine

## 2023-01-16 DIAGNOSIS — M19012 Primary osteoarthritis, left shoulder: Secondary | ICD-10-CM

## 2023-01-16 NOTE — Progress Notes (Signed)
[name] - 87 y.o. male MRN [id number]  Date of birth: Aug 22, 1935  PCP: Plotnikov, Georgina Quint, MD  Subjective:  No chief complaint on file.  L shoulder OA  HPI: Past Medical, Surgical, Social, and Family History Reviewed & Updated per EMR.   Patient is a 87 y.o. male here for follow up on L glenohumeral OA, last seen on 10/1 for ECSWT. He has noticed some small improvements over the last two visits.   Past Medical History:  Diagnosis Date   CHOLELITHIASIS 12/17/2007   COLONIC POLYPS, ADENOMATOUS, HX OF 12/17/2007   DEGENERATIVE DISC DISEASE, CERVICAL SPINE 02/19/2008   DISC DISEASE, LUMBAR 12/17/2007   HYPERTENSION 12/17/2007   INTERNAL HEMORRHOIDS 12/17/2007   Overweight(278.02) 02/19/2008   POLIOMYELITIS, HX OF 12/17/2007   RENAL CYST 12/17/2007   SHINGLES, HX OF 12/17/2007    Current Outpatient Medications on File Prior to Visit  Medication Sig Dispense Refill   diazepam (VALIUM) 5 MG tablet TAKE ONE TABLET BY MOUTH AT BEDTIME AS NEEDED 30 tablet 5   diphenhydrAMINE (BENADRYL) 25 MG tablet Take 1 tablet by mouth 3 (three) times daily as needed. 2 by mouth at bedtime      losartan (COZAAR) 25 MG tablet Take 2 tablets (50 mg total) by mouth daily. 180 tablet 3   Magnesium 400 MG CAPS Take by mouth.     Multiple Vitamin (MULTIVITAMIN) capsule Take 2 capsules by mouth daily.      Omega-3 1000 MG CAPS Take 1 capsule by mouth daily.     vitamin C (ASCORBIC ACID) 500 MG tablet Take 500 mg by mouth daily.     No current facility-administered medications on file prior to visit.    Past Surgical History:  Procedure Laterality Date   Bilateral knee surgery     arthroscopy bilateral - Dr. Thurston Hole   CERVICAL FUSION     HEMORRHOID SURGERY     lumbar back surgery     two procedures: diskectomy and then a fusion L4-5, L5-S1 Dr. Newell Coral   TONSILLECTOMY      Allergies  Allergen Reactions   Amoxicillin Swelling and Rash    Has patient had a PCN reaction causing immediate rash,  facial/tongue/throat swelling, SOB or lightheadedness with hypotension: No Has patient had a PCN reaction causing severe rash involving mucus membranes or skin necrosis: No Has patient had a PCN reaction that required hospitalization No Has patient had a PCN reaction occurring within the last 10 years: No If all of the above answers are "NO", then may proceed with Cephalosporin use.        Objective:  Physical Exam: VS: BP:   HR: bpm  TEMP: ( )  RESP:   HT:    WT:   BMI:   Gen: NAD, speaks clearly, comfortable in exam room Respiratory: Normal respiratory effort on room air. No signs of distress Skin: No rashes, abrasions, or ecchymosis MSK: Full Rom of the L shoulder w/ minimal crepitus  Strength 5/5 No tenderness over the [facility] or posteriorly No overlying edema    Assessment & Plan:   Osteoarthritis of glenohumeral joint, left Procedure: ECSWT Indications:  Osteoarthritis of L shoulder   Procedure Details Consent: Risks of procedure as well as the alternatives and risks of each were explained to the patient.  Written consent for procedure obtained. Time Out: Verified patient identification, verified procedure, site was marked, verified correct patient position, medications/allergies/relevent history reviewed.  The area was cleaned with alcohol swab.  The L glenohumeral joint was targeted for Extracorporeal shockwave therapy.    Preset: shoulder Power Level: 100  Frequency: 14Hz  Impulse/cycles: 2000 Head size: large   Patient tolerated procedure well without immediate complications.  After treatment the patient had lest crepitus and decreased pain with ROM.  We will follow up in 1 wk for repeat therapy and evaluate at that point for further need.        Rica Mote MD [facility] Sports Medicine Fellow  Addendum:  Patient seen in the office by fellow.   History, exam, plan of care were precepted with me.  Darene Lamer, DO, CAQSM

## 2023-01-16 NOTE — Patient Instructions (Signed)

## 2023-01-16 NOTE — Assessment & Plan Note (Signed)
Procedure: ECSWT Indications:  Osteoarthritis of L shoulder   Procedure Details Consent: Risks of procedure as well as the alternatives and risks of each were explained to the patient.  Written consent for procedure obtained. Time Out: Verified patient identification, verified procedure, site was marked, verified correct patient position, medications/allergies/relevent history reviewed.  The area was cleaned with alcohol swab.     The L glenohumeral joint was targeted for Extracorporeal shockwave therapy.    Preset: shoulder Power Level: 100  Frequency: 14Hz  Impulse/cycles: 2000 Head size: large   Patient tolerated procedure well without immediate complications.  After treatment the patient had lest crepitus and decreased pain with ROM.  We will follow up in 1 wk for repeat therapy and evaluate at that point for further need.

## 2023-01-23 ENCOUNTER — Ambulatory Visit (INDEPENDENT_AMBULATORY_CARE_PROVIDER_SITE_OTHER): Payer: Self-pay | Admitting: Family Medicine

## 2023-01-23 DIAGNOSIS — M19012 Primary osteoarthritis, left shoulder: Secondary | ICD-10-CM

## 2023-01-23 NOTE — Progress Notes (Signed)
[name] - 87 y.o. male MRN [id number]  Date of birth: 1935/05/11  PCP: Perry, Adam Quint, MD  Subjective:  No chief complaint on file. Left shoulder pain  HPI: Past Medical, Surgical, Social, and Family History Reviewed & Updated per EMR.   Patient is a 87 y.o. male here for follow up on left shoulder pain secondary to glenohumeral osteoarthritis, last seen on 01/16/2023.  He feels shockwave therapy may be beneficial but it is "hard to tell" how much improvement he is getting.  He does report some decreased crepitation when moving his shoulder, but still has some trouble putting on a jacket.  Past Medical History:  Diagnosis Date   CHOLELITHIASIS 12/17/2007   COLONIC POLYPS, ADENOMATOUS, HX OF 12/17/2007   DEGENERATIVE DISC DISEASE, CERVICAL SPINE 02/19/2008   DISC DISEASE, LUMBAR 12/17/2007   HYPERTENSION 12/17/2007   INTERNAL HEMORRHOIDS 12/17/2007   Overweight(278.02) 02/19/2008   POLIOMYELITIS, HX OF 12/17/2007   RENAL CYST 12/17/2007   SHINGLES, HX OF 12/17/2007    Current Outpatient Medications on File Prior to Visit  Medication Sig Dispense Refill   diazepam (VALIUM) 5 MG tablet TAKE ONE TABLET BY MOUTH AT BEDTIME AS NEEDED 30 tablet 5   diphenhydrAMINE (BENADRYL) 25 MG tablet Take 1 tablet by mouth 3 (three) times daily as needed. 2 by mouth at bedtime      losartan (COZAAR) 25 MG tablet Take 2 tablets (50 mg total) by mouth daily. 180 tablet 3   Magnesium 400 MG CAPS Take by mouth.     Multiple Vitamin (MULTIVITAMIN) capsule Take 2 capsules by mouth daily.      Omega-3 1000 MG CAPS Take 1 capsule by mouth daily.     vitamin C (ASCORBIC ACID) 500 MG tablet Take 500 mg by mouth daily.     No current facility-administered medications on file prior to visit.    Past Surgical History:  Procedure Laterality Date   Bilateral knee surgery     arthroscopy bilateral - Dr. Thurston Perry   CERVICAL FUSION     HEMORRHOID SURGERY     lumbar back surgery     two procedures:  diskectomy and then a fusion L4-5, L5-S1 Dr. Newell Perry   TONSILLECTOMY      Allergies  Allergen Reactions   Amoxicillin Swelling and Rash    Has patient had a PCN reaction causing immediate rash, facial/tongue/throat swelling, SOB or lightheadedness with hypotension: No Has patient had a PCN reaction causing severe rash involving mucus membranes or skin necrosis: No Has patient had a PCN reaction that required hospitalization No Has patient had a PCN reaction occurring within the last 10 years: No If all of the above answers are "NO", then may proceed with Cephalosporin use.        Objective:  Physical Exam: VS: BP:   HR: bpm  TEMP: ( )  RESP:   HT:    WT:   BMI:   Gen: NAD, speaks clearly, comfortable in exam room Respiratory: Normal respiratory effort on room air. No signs of distress Skin: No rashes, abrasions, or ecchymosis MSK: No obvious deformity of the left shoulder End-stage healing of mild bruise over the posterior aspect of the glenohumeral joint No overlying erythema, edema, or palpated warmth of the joint ROM full in flexion, abduction to 120 degrees, ER to 60 degrees and IR to just past midline.  No TTP over the posterior aspect or AC joint    Assessment & Plan:   Osteoarthritis of glenohumeral joint, left  [name] feels like the shockwave may be helping and would like to continue with today's appointment.  Procedure: ECSWT Indications: Pain secondary to osteoarthritis of glenohumeral joint   Procedure Details Consent: Risks of procedure as well as the alternatives and risks of each were explained to the patient.  Written consent for procedure obtained. Time Out: Verified patient identification, verified procedure, site was marked, verified correct patient position, medications/allergies/relevent history reviewed.  The area was cleaned with alcohol swab.     The left glenohumeral joint was targeted for Extracorporeal shockwave therapy.    Preset:  Shoulder Power Level: 100 MJ  Frequency: 14 Hz Impulse/cycles: 2000  Head size: Large   Patient tolerated procedure well without immediate complications.  Since his progress has been hard for him to evaluate, we will see how he does over the next couple days.  If he finds that shockwave has been beneficial for him he can schedule another appointment.  If not we will see him as needed.     Adam Mote MD [facility] Fellow  Addendum:  Patient seen in the office by fellow.  History, exam, plan of care were precepted with me.  Adam Lamer, DO, CAQSM

## 2023-01-23 NOTE — Assessment & Plan Note (Addendum)
[name] feels like the shockwave may be helping and would like to continue with today's appointment.  Procedure: ECSWT Indications: Pain secondary to osteoarthritis of glenohumeral joint   Procedure Details Consent: Risks of procedure as well as the alternatives and risks of each were explained to the patient.  Written consent for procedure obtained. Time Out: Verified patient identification, verified procedure, site was marked, verified correct patient position, medications/allergies/relevent history reviewed.  The area was cleaned with alcohol swab.     The left glenohumeral joint was targeted for Extracorporeal shockwave therapy.    Preset: Shoulder Power Level: 100 MJ  Frequency: 14 Hz Impulse/cycles: 2000  Head size: Large   Patient tolerated procedure well without immediate complications.  Since his progress has been hard for him to evaluate, we will see how he does over the next couple days.  If he finds that shockwave has been beneficial for him he can schedule another appointment.  If not we will see him as needed.

## 2023-02-15 DIAGNOSIS — M25512 Pain in left shoulder: Secondary | ICD-10-CM | POA: Diagnosis not present

## 2023-02-15 DIAGNOSIS — M25511 Pain in right shoulder: Secondary | ICD-10-CM | POA: Diagnosis not present

## 2023-03-19 ENCOUNTER — Encounter: Payer: Self-pay | Admitting: Internal Medicine

## 2023-03-19 ENCOUNTER — Ambulatory Visit (INDEPENDENT_AMBULATORY_CARE_PROVIDER_SITE_OTHER): Payer: Medicare Other | Admitting: Internal Medicine

## 2023-03-19 VITALS — BP 120/78 | HR 76 | Temp 98.6°F | Ht 69.0 in | Wt 178.0 lb

## 2023-03-19 DIAGNOSIS — Z125 Encounter for screening for malignant neoplasm of prostate: Secondary | ICD-10-CM

## 2023-03-19 DIAGNOSIS — N182 Chronic kidney disease, stage 2 (mild): Secondary | ICD-10-CM

## 2023-03-19 DIAGNOSIS — L853 Xerosis cutis: Secondary | ICD-10-CM | POA: Insufficient documentation

## 2023-03-19 DIAGNOSIS — N320 Bladder-neck obstruction: Secondary | ICD-10-CM

## 2023-03-19 DIAGNOSIS — I10 Essential (primary) hypertension: Secondary | ICD-10-CM | POA: Diagnosis not present

## 2023-03-19 LAB — CBC WITH DIFFERENTIAL/PLATELET
Basophils Absolute: 0 10*3/uL (ref 0.0–0.1)
Basophils Relative: 0.7 % (ref 0.0–3.0)
Eosinophils Absolute: 0.2 10*3/uL (ref 0.0–0.7)
Eosinophils Relative: 2.9 % (ref 0.0–5.0)
HCT: 43.7 % (ref 39.0–52.0)
Hemoglobin: 14.6 g/dL (ref 13.0–17.0)
Lymphocytes Relative: 27.8 % (ref 12.0–46.0)
Lymphs Abs: 1.6 10*3/uL (ref 0.7–4.0)
MCHC: 33.3 g/dL (ref 30.0–36.0)
MCV: 98.8 fL (ref 78.0–100.0)
Monocytes Absolute: 0.5 10*3/uL (ref 0.1–1.0)
Monocytes Relative: 8.1 % (ref 3.0–12.0)
Neutro Abs: 3.5 10*3/uL (ref 1.4–7.7)
Neutrophils Relative %: 60.5 % (ref 43.0–77.0)
Platelets: 189 10*3/uL (ref 150.0–400.0)
RBC: 4.42 Mil/uL (ref 4.22–5.81)
RDW: 13.8 % (ref 11.5–15.5)
WBC: 5.7 10*3/uL (ref 4.0–10.5)

## 2023-03-19 LAB — URINALYSIS
Bilirubin Urine: NEGATIVE
Hgb urine dipstick: NEGATIVE
Ketones, ur: NEGATIVE
Leukocytes,Ua: NEGATIVE
Nitrite: NEGATIVE
Specific Gravity, Urine: 1.025 (ref 1.000–1.030)
Total Protein, Urine: NEGATIVE
Urine Glucose: NEGATIVE
Urobilinogen, UA: 1 (ref 0.0–1.0)
pH: 6.5 (ref 5.0–8.0)

## 2023-03-19 LAB — COMPREHENSIVE METABOLIC PANEL
ALT: 23 U/L (ref 0–53)
AST: 22 U/L (ref 0–37)
Albumin: 4 g/dL (ref 3.5–5.2)
Alkaline Phosphatase: 54 U/L (ref 39–117)
BUN: 24 mg/dL — ABNORMAL HIGH (ref 6–23)
CO2: 30 meq/L (ref 19–32)
Calcium: 9.5 mg/dL (ref 8.4–10.5)
Chloride: 105 meq/L (ref 96–112)
Creatinine, Ser: 1.14 mg/dL (ref 0.40–1.50)
GFR: 58.58 mL/min — ABNORMAL LOW (ref >60.00)
Glucose, Bld: 91 mg/dL (ref 70–99)
Potassium: 4.1 meq/L (ref 3.5–5.1)
Sodium: 143 meq/L (ref 135–145)
Total Bilirubin: 0.7 mg/dL (ref 0.2–1.2)
Total Protein: 6.2 g/dL (ref 6.0–8.3)

## 2023-03-19 LAB — LIPID PANEL
Cholesterol: 205 mg/dL — ABNORMAL HIGH (ref 0–200)
HDL: 88.3 mg/dL (ref >39.00)
LDL Cholesterol: 105 mg/dL — ABNORMAL HIGH (ref 0–99)
NonHDL: 116.58
Total CHOL/HDL Ratio: 2
Triglycerides: 56 mg/dL (ref 0.0–149.0)
VLDL: 11.2 mg/dL (ref 0.0–40.0)

## 2023-03-19 LAB — PSA: PSA: 1.88 ng/mL (ref 0.10–4.00)

## 2023-03-19 LAB — TSH: TSH: 2.43 u[IU]/mL (ref 0.35–5.50)

## 2023-03-19 MED ORDER — DIAZEPAM 5 MG PO TABS: 5.0000 mg | ORAL_TABLET | Freq: Every evening | ORAL | 5 refills | Status: DC | PRN

## 2023-03-19 NOTE — Assessment & Plan Note (Signed)
Use aquaphor 

## 2023-03-19 NOTE — Assessment & Plan Note (Signed)
On Losartan 

## 2023-03-19 NOTE — Progress Notes (Signed)
Subjective:  Patient ID: [name], male    DOB: June 26, 1935  Age: 87 y.o. MRN: [id number]  CC: Medical Management of Chronic Issues (6 mnth f/u)   HPI SAAMIR SELINSKY presents for a 6 months f/u HTN, axiety  Outpatient Medications Prior to Visit  Medication Sig Dispense Refill   celecoxib (CELEBREX) 200 MG capsule Take 200 mg by mouth 2 (two) times daily.     diphenhydrAMINE (BENADRYL) 25 MG tablet Take 1 tablet by mouth 3 (three) times daily as needed. 2 by mouth at bedtime      losartan (COZAAR) 25 MG tablet Take 2 tablets (50 mg total) by mouth daily. 180 tablet 3   Magnesium 400 MG CAPS Take by mouth.     Multiple Vitamin (MULTIVITAMIN) capsule Take 2 capsules by mouth daily.      Omega-3 1000 MG CAPS Take 1 capsule by mouth daily.     vitamin C (ASCORBIC ACID) 500 MG tablet Take 500 mg by mouth daily.     diazepam (VALIUM) 5 MG tablet TAKE ONE TABLET BY MOUTH AT BEDTIME AS NEEDED 30 tablet 5   No facility-administered medications prior to visit.    ROS: Review of Systems  Constitutional:  Negative for appetite change, fatigue and unexpected weight change.  HENT:  Negative for congestion, nosebleeds, sneezing, sore throat and trouble swallowing.   Eyes:  Negative for itching and visual disturbance.  Respiratory:  Negative for cough.   Cardiovascular:  Negative for chest pain, palpitations and leg swelling.  Gastrointestinal:  Negative for abdominal distention, blood in stool, diarrhea and nausea.  Genitourinary:  Negative for frequency and hematuria.  Musculoskeletal:  Negative for back pain, gait problem, joint swelling and neck pain.  Skin:  Negative for rash.  Neurological:  Negative for dizziness, tremors, speech difficulty and weakness.  Psychiatric/Behavioral:  Negative for agitation, dysphoric mood and sleep disturbance. The patient is nervous/anxious.     Objective:  BP 120/78 (BP Location: Left Arm, Patient Position: Sitting, Cuff Size: Normal)   Pulse 76    Temp 98.6 F (37 C) (Oral)   Ht 5\' 9"  (1.753 m)   Wt 178 lb (80.7 kg)   SpO2 97%   BMI 26.29 kg/m   BP Readings from Last 3 Encounters:  03/19/23 120/78  01/01/23 124/86  09/17/22 (!) 140/78    Wt Readings from Last 3 Encounters:  03/19/23 178 lb (80.7 kg)  01/01/23 180 lb (81.6 kg)  09/17/22 192 lb (87.1 kg)    Physical Exam Constitutional:      General: He is not in acute distress.    Appearance: He is well-developed.     Comments: NAD  Eyes:     Conjunctiva/sclera: Conjunctivae normal.     Pupils: Pupils are equal, round, and reactive to light.  Neck:     Thyroid: No thyromegaly.     Vascular: No JVD.  Cardiovascular:     Rate and Rhythm: Normal rate and regular rhythm.     Heart sounds: Normal heart sounds. No murmur heard.    No friction rub. No gallop.  Pulmonary:     Effort: Pulmonary effort is normal. No respiratory distress.     Breath sounds: Normal breath sounds. No wheezing or rales.  Chest:     Chest wall: No tenderness.  Abdominal:     General: Bowel sounds are normal. There is no distension.     Palpations: Abdomen is soft. There is no mass.     Tenderness:  There is no abdominal tenderness. There is no guarding or rebound.  Musculoskeletal:        General: No tenderness. Normal range of motion.     Cervical back: Normal range of motion.  Lymphadenopathy:     Cervical: No cervical adenopathy.  Skin:    General: Skin is warm and dry.     Findings: No rash.  Neurological:     Mental Status: He is alert and oriented to person, place, and time.     Cranial Nerves: No cranial nerve deficit.     Motor: No abnormal muscle tone.     Coordination: Coordination normal.     Gait: Gait normal.     Deep Tendon Reflexes: Reflexes are normal and symmetric.  Psychiatric:        Behavior: Behavior normal.        Thought Content: Thought content normal.        Judgment: Judgment normal.     Lab Results  Component Value Date   WBC 6.0 09/17/2022   HGB  15.3 09/17/2022   HCT 46.3 09/17/2022   PLT 208.0 09/17/2022   GLUCOSE 81 09/17/2022   CHOL 207 (H) 09/17/2022   TRIG 69.0 09/17/2022   HDL 80.90 09/17/2022   LDLDIRECT 119.5 09/24/2006   LDLCALC 112 (H) 09/17/2022   ALT 26 09/17/2022   AST 26 09/17/2022   NA 139 09/17/2022   K 4.3 09/17/2022   CL 102 09/17/2022   CREATININE 1.30 09/17/2022   BUN 22 09/17/2022   CO2 31 09/17/2022   TSH 2.82 09/17/2022   PSA 1.26 08/29/2021    DG Cervical Spine Complete Result Date: 05/26/2018 CLINICAL DATA:  Neck pain with decreased range of motion for 2 days, no known injury, initial encounter EXAM: CERVICAL SPINE - COMPLETE 4+ VIEW COMPARISON:  04/12/2009 FINDINGS: There again noted changes consistent with fusion extending from C3-C7 with anterior fixation. Osteophytic changes at C2-3 are noted. No prevertebral soft tissue changes are noted. No hardware failure is seen. The neural foramina show narrowing at 6 7 bilaterally. No soft tissue abnormality is seen. The odontoid is within normal limits. IMPRESSION: Postsurgical and degenerative changes without acute abnormality Electronically Signed   By: Alcide Clever M.D.   On: 05/26/2018 15:25    Assessment & Plan:   Problem List Items Addressed This Visit     Essential hypertension - Primary   On Losartan      Relevant Orders   TSH   Urinalysis   CBC with Differential/Platelet   Lipid panel   PSA   Comprehensive metabolic panel   Chronic renal insufficiency, stage 2 (mild)   Relevant Orders   CBC with Differential/Platelet   Comprehensive metabolic panel   Dry skin   Use aquaphor      Relevant Orders   TSH   Urinalysis   CBC with Differential/Platelet   Lipid panel   PSA   Comprehensive metabolic panel   Other Visit Diagnoses       Bladder neck obstruction       Relevant Orders   PSA         Meds ordered this encounter  Medications   diazepam (VALIUM) 5 MG tablet    Sig: Take 1 tablet (5 mg total) by mouth at bedtime  as needed.    Dispense:  30 tablet    Refill:  5      Follow-up: Return in about 6 months (around 09/17/2023) for a follow-up visit.  Alex Ninetta Adelstein,  MD

## 2023-03-21 ENCOUNTER — Encounter: Payer: Self-pay | Admitting: Internal Medicine

## 2023-04-09 NOTE — Progress Notes (Deleted)
   Acute Office Visit  Subjective:     Patient ID: [name], male    DOB: Oct 29, 1935, 88 y.o.   MRN: [id number]  No chief complaint on file.   HPI Patient is in today for evaluation of ***, for the last ***. Has tried Denies known sick contacts, Denies abdominal pain, nausea, vomiting, diarrhea, rash, fever, chills, other symptoms.  Medical hx as outlined below.  ROS Per HPI      Objective:    There were no vitals taken for this visit.   Physical Exam Vitals and nursing note reviewed.  HENT:     Head: Normocephalic and atraumatic.     Nose: Congestion present.     Mouth/Throat:     Mouth: Mucous membranes are moist.     Pharynx: Oropharynx is clear. No oropharyngeal exudate or posterior oropharyngeal erythema.  Eyes:     Extraocular Movements: Extraocular movements intact.  Cardiovascular:     Rate and Rhythm: Normal rate and regular rhythm.  Pulmonary:     Effort: Pulmonary effort is normal.  Musculoskeletal:     Cervical back: Normal range of motion.  Lymphadenopathy:     Cervical: Cervical adenopathy present.  Neurological:     Mental Status: He is alert.   No results found for any visits on 04/10/23.      Assessment & Plan:  ***  No orders of the defined types were placed in this encounter.   No follow-ups on file.  Corean Ku, FNP

## 2023-04-10 ENCOUNTER — Ambulatory Visit: Payer: Medicare Other | Admitting: Family Medicine

## 2023-04-24 DIAGNOSIS — H25013 Cortical age-related cataract, bilateral: Secondary | ICD-10-CM | POA: Diagnosis not present

## 2023-04-24 DIAGNOSIS — H5213 Myopia, bilateral: Secondary | ICD-10-CM | POA: Diagnosis not present

## 2023-04-24 DIAGNOSIS — H43813 Vitreous degeneration, bilateral: Secondary | ICD-10-CM | POA: Diagnosis not present

## 2023-04-24 DIAGNOSIS — H2513 Age-related nuclear cataract, bilateral: Secondary | ICD-10-CM | POA: Diagnosis not present

## 2023-04-24 DIAGNOSIS — H52203 Unspecified astigmatism, bilateral: Secondary | ICD-10-CM | POA: Diagnosis not present

## 2023-04-24 DIAGNOSIS — H524 Presbyopia: Secondary | ICD-10-CM | POA: Diagnosis not present

## 2023-05-27 DIAGNOSIS — H2512 Age-related nuclear cataract, left eye: Secondary | ICD-10-CM | POA: Diagnosis not present

## 2023-05-27 DIAGNOSIS — H25812 Combined forms of age-related cataract, left eye: Secondary | ICD-10-CM | POA: Diagnosis not present

## 2023-05-27 DIAGNOSIS — H2589 Other age-related cataract: Secondary | ICD-10-CM | POA: Diagnosis not present

## 2023-05-27 DIAGNOSIS — H25012 Cortical age-related cataract, left eye: Secondary | ICD-10-CM | POA: Diagnosis not present

## 2023-06-17 DIAGNOSIS — H2511 Age-related nuclear cataract, right eye: Secondary | ICD-10-CM | POA: Diagnosis not present

## 2023-06-17 DIAGNOSIS — H25011 Cortical age-related cataract, right eye: Secondary | ICD-10-CM | POA: Diagnosis not present

## 2023-06-17 DIAGNOSIS — H25811 Combined forms of age-related cataract, right eye: Secondary | ICD-10-CM | POA: Diagnosis not present

## 2023-06-27 DIAGNOSIS — H9313 Tinnitus, bilateral: Secondary | ICD-10-CM | POA: Diagnosis not present

## 2023-06-27 DIAGNOSIS — H918X3 Other specified hearing loss, bilateral: Secondary | ICD-10-CM | POA: Diagnosis not present

## 2023-06-27 DIAGNOSIS — Z01118 Encounter for examination of ears and hearing with other abnormal findings: Secondary | ICD-10-CM | POA: Diagnosis not present

## 2023-06-27 DIAGNOSIS — H903 Sensorineural hearing loss, bilateral: Secondary | ICD-10-CM | POA: Diagnosis not present

## 2023-07-30 ENCOUNTER — Encounter: Payer: Self-pay | Admitting: Internal Medicine

## 2023-07-30 ENCOUNTER — Ambulatory Visit: Payer: Self-pay | Admitting: Internal Medicine

## 2023-07-30 ENCOUNTER — Ambulatory Visit (INDEPENDENT_AMBULATORY_CARE_PROVIDER_SITE_OTHER): Admitting: Internal Medicine

## 2023-07-30 VITALS — BP 120/88 | HR 89 | Temp 97.9°F | Ht 69.0 in | Wt 185.0 lb

## 2023-07-30 DIAGNOSIS — M5137 Other intervertebral disc degeneration, lumbosacral region with discogenic back pain only: Secondary | ICD-10-CM

## 2023-07-30 NOTE — Progress Notes (Signed)
   Subjective:   Patient ID: [name], male    DOB: [birth year], 88 y.o.   MRN: [id number]  HPI The patient is an 88 YO man coming in for back pain 2 days. No trigger. Took advil yesterday did not help much. Prior lumbar fusion does not feel like that. No radiation, numbness, weakness.   Review of Systems  Constitutional:  Positive for activity change. Negative for appetite change, fatigue, fever and unexpected weight change.  Respiratory: Negative.    Cardiovascular: Negative.   Musculoskeletal:  Positive for back pain and myalgias. Negative for arthralgias.  Skin: Negative.   Neurological:  Negative for syncope, weakness and numbness.    Objective:  Physical Exam Constitutional:      Appearance: He is well-developed.  HENT:     Head: Normocephalic and atraumatic.  Cardiovascular:     Rate and Rhythm: Normal rate and regular rhythm.  Pulmonary:     Effort: Pulmonary effort is normal. No respiratory distress.     Breath sounds: Normal breath sounds. No wheezing or rales.  Abdominal:     General: Bowel sounds are normal. There is no distension.     Palpations: Abdomen is soft.     Tenderness: There is no abdominal tenderness. There is no rebound.  Musculoskeletal:     Cervical back: Normal range of motion.  Skin:    General: Skin is warm and dry.  Neurological:     Mental Status: He is alert and oriented to person, place, and time.     Coordination: Coordination normal.     Vitals:   07/30/23 1059  BP: 120/88  Pulse: 89  Temp: 97.9 F (36.6 C)  TempSrc: Oral  SpO2: 99%  Weight: 185 lb (83.9 kg)  Height: 5\' 9"  (1.753 m)    Assessment & Plan:

## 2023-07-30 NOTE — Patient Instructions (Signed)
 This is probably muscular so try advil for the next few days and let us  know in 1-2 weeks if not improving or worsening.

## 2023-07-30 NOTE — Telephone Encounter (Signed)
 Chief Complaint: Lower back pain started yesterday  Symptoms: 5/10 pain level Frequency: Constant Pertinent Negatives: Patient denies pain radiation, weakness, numbness, bowel/bladder control, blood in urine, abdomen pain  Disposition:  [x] Appointment(In office)  Additional Notes: Patient scheduled for an appointment today in office. This RN educated pt on home care, new-worsening symptoms, when to call back/seek emergent care. Pt verbalized understanding and agrees to plan.    Copied from CRM [id number]. Topic: Clinical - Red Word Triage >> Jul 30, 2023  9:41 AM Alethia Huxley E wrote: [facility] that prompted transfer to Nurse Triage: Back pain. Patient stated he started experiencing lower back pain a couple days ago and unsure of how it started. Patient rated the pain a level 5 out of 10. Reason for Disposition  [1] MODERATE back pain (e.g., interferes with normal activities) AND [2] present > 3 days  Answer Assessment - Initial Assessment Questions Chief Complaint: Lower back pain started yesterday Pt is not sure what is causing it/hasn't had before  Symptoms: 5/10 pain level  Frequency: Constant  Pertinent Negatives: Patient denies pain radiation, weakness, numbness, bowel/bladder control, blood in urine, abdomen pain  Protocols used: Back Pain-A-AH

## 2023-07-30 NOTE — Assessment & Plan Note (Signed)
 With new pain left lower paraspinal region. This appears to be muscular. 2 days in onset no imaging required today. No red flag signs. Advised to use nsaids scheduled for a few days. Let us  know if not improving or worsening in 1-2 weeks and if indicated can order x-ray then.

## 2023-09-17 ENCOUNTER — Encounter: Payer: Self-pay | Admitting: Internal Medicine

## 2023-09-17 ENCOUNTER — Ambulatory Visit (INDEPENDENT_AMBULATORY_CARE_PROVIDER_SITE_OTHER): Payer: Medicare Other | Admitting: Internal Medicine

## 2023-09-17 VITALS — BP 118/68 | HR 67 | Temp 98.2°F | Ht 69.0 in | Wt 185.0 lb

## 2023-09-17 DIAGNOSIS — M545 Low back pain, unspecified: Secondary | ICD-10-CM | POA: Diagnosis not present

## 2023-09-17 DIAGNOSIS — N182 Chronic kidney disease, stage 2 (mild): Secondary | ICD-10-CM | POA: Diagnosis not present

## 2023-09-17 DIAGNOSIS — M25512 Pain in left shoulder: Secondary | ICD-10-CM

## 2023-09-17 DIAGNOSIS — I10 Essential (primary) hypertension: Secondary | ICD-10-CM

## 2023-09-17 DIAGNOSIS — M25511 Pain in right shoulder: Secondary | ICD-10-CM

## 2023-09-17 DIAGNOSIS — G8929 Other chronic pain: Secondary | ICD-10-CM

## 2023-09-17 LAB — COMPREHENSIVE METABOLIC PANEL WITH GFR
ALT: 22 U/L (ref 0–53)
AST: 25 U/L (ref 0–37)
Albumin: 4.2 g/dL (ref 3.5–5.2)
Alkaline Phosphatase: 62 U/L (ref 39–117)
BUN: 25 mg/dL — ABNORMAL HIGH (ref 6–23)
CO2: 32 meq/L (ref 19–32)
Calcium: 9.5 mg/dL (ref 8.4–10.5)
Chloride: 105 meq/L (ref 96–112)
Creatinine, Ser: 1.16 mg/dL (ref 0.40–1.50)
GFR: 57.17 mL/min — ABNORMAL LOW
Glucose, Bld: 83 mg/dL (ref 70–99)
Potassium: 4.2 meq/L (ref 3.5–5.1)
Sodium: 141 meq/L (ref 135–145)
Total Bilirubin: 0.7 mg/dL (ref 0.2–1.2)
Total Protein: 7.1 g/dL (ref 6.0–8.3)

## 2023-09-17 MED ORDER — DIAZEPAM 5 MG PO TABS
5.0000 mg | ORAL_TABLET | Freq: Every evening | ORAL | 1 refills | Status: AC | PRN
Start: 2023-09-17 — End: ?

## 2023-09-17 MED ORDER — LOSARTAN POTASSIUM 25 MG PO TABS: 50.0000 mg | ORAL_TABLET | Freq: Every day | ORAL | 3 refills | Status: AC

## 2023-09-17 NOTE — Assessment & Plan Note (Signed)
 Doing well

## 2023-09-17 NOTE — Patient Instructions (Addendum)
 Infrared heating pad for shoulders

## 2023-09-17 NOTE — Assessment & Plan Note (Signed)
 CBD oil/oral Infrared heating pad for shoulders

## 2023-09-17 NOTE — Assessment & Plan Note (Signed)
On Losartan 

## 2023-09-17 NOTE — Assessment & Plan Note (Signed)
GFR 49- 56; stage IIIa Continue with good hydration

## 2023-09-17 NOTE — Progress Notes (Addendum)
 Subjective:  Patient ID: [name], male    DOB: Oct 03, 1935  Age: 88 y.o. MRN: [id number]  CC: Medical Management of Chronic Issues (6 mnth f/u)   HPI [name] presents for HTN, insomnia, B shoulder pain  Outpatient Medications Prior to Visit  Medication Sig Dispense Refill   diphenhydrAMINE  (BENADRYL ) 25 MG tablet Take 1 tablet by mouth 3 (three) times daily as needed. 2 by mouth at bedtime      Magnesium 400 MG CAPS Take by mouth.     Multiple Vitamin (MULTIVITAMIN) capsule Take 2 capsules by mouth daily.      Omega-3 1000 MG CAPS Take 1 capsule by mouth daily.     vitamin C  (ASCORBIC ACID ) 500 MG tablet Take 500 mg by mouth daily.     diazepam  (VALIUM ) 5 MG tablet Take 1 tablet (5 mg total) by mouth at bedtime as needed. 30 tablet 5   losartan  (COZAAR ) 25 MG tablet Take 2 tablets (50 mg total) by mouth daily. 180 tablet 3   celecoxib (CELEBREX) 200 MG capsule Take 200 mg by mouth 2 (two) times daily. (Patient not taking: Reported on 07/30/2023)     No facility-administered medications prior to visit.    ROS: Review of Systems  Constitutional:  Negative for appetite change, fatigue and unexpected weight change.  HENT:  Negative for congestion, nosebleeds, sneezing, sore throat and trouble swallowing.   Eyes:  Negative for itching and visual disturbance.  Respiratory:  Negative for cough.   Cardiovascular:  Negative for chest pain, palpitations and leg swelling.  Gastrointestinal:  Negative for abdominal distention, blood in stool, diarrhea and nausea.  Genitourinary:  Negative for frequency and hematuria.  Musculoskeletal:  Positive for arthralgias. Negative for back pain, gait problem, joint swelling and neck pain.  Skin:  Negative for rash.  Neurological:  Negative for dizziness, tremors, speech difficulty and weakness.  Psychiatric/Behavioral:  Positive for sleep disturbance. Negative for agitation, dysphoric mood and suicidal ideas. The patient is not  nervous/anxious.     Objective:  BP 118/68   Pulse 67   Temp 98.2 F (36.8 C) (Oral)   Ht 5' 9 (1.753 m)   Wt 185 lb (83.9 kg)   SpO2 97%   BMI 27.32 kg/m   BP Readings from Last 3 Encounters:  09/17/23 118/68  07/30/23 120/88  03/19/23 120/78    Wt Readings from Last 3 Encounters:  09/17/23 185 lb (83.9 kg)  07/30/23 185 lb (83.9 kg)  03/19/23 178 lb (80.7 kg)    Physical Exam Constitutional:      General: He is not in acute distress.    Appearance: He is well-developed.     Comments: NAD   Eyes:     Conjunctiva/sclera: Conjunctivae normal.     Pupils: Pupils are equal, round, and reactive to light.   Neck:     Thyroid : No thyromegaly.     Vascular: No JVD.   Cardiovascular:     Rate and Rhythm: Normal rate and regular rhythm.     Heart sounds: Normal heart sounds. No murmur heard.    No friction rub. No gallop.  Pulmonary:     Effort: Pulmonary effort is normal. No respiratory distress.     Breath sounds: Normal breath sounds. No wheezing or rales.  Chest:     Chest wall: No tenderness.  Abdominal:     General: Bowel sounds are normal. There is no distension.     Palpations: Abdomen is soft. There  is no mass.     Tenderness: There is no abdominal tenderness. There is no guarding or rebound.   Musculoskeletal:        General: No tenderness. Normal range of motion.     Cervical back: Normal range of motion.  Lymphadenopathy:     Cervical: No cervical adenopathy.   Skin:    General: Skin is warm and dry.     Findings: No rash.   Neurological:     Mental Status: He is alert and oriented to person, place, and time.     Cranial Nerves: No cranial nerve deficit.     Motor: No abnormal muscle tone.     Coordination: Coordination normal.     Gait: Gait normal.     Deep Tendon Reflexes: Reflexes are normal and symmetric.   Psychiatric:        Behavior: Behavior normal.        Thought Content: Thought content normal.        Judgment: Judgment normal.    Shoulders w/decreased ROM  Lab Results  Component Value Date   WBC 5.7 03/19/2023   HGB 14.6 03/19/2023   HCT 43.7 03/19/2023   PLT 189.0 03/19/2023   GLUCOSE 91 03/19/2023   CHOL 205 (H) 03/19/2023   TRIG 56.0 03/19/2023   HDL 88.30 03/19/2023   LDLDIRECT 119.5 09/24/2006   LDLCALC 105 (H) 03/19/2023   ALT 23 03/19/2023   AST 22 03/19/2023   NA 143 03/19/2023   K 4.1 03/19/2023   CL 105 03/19/2023   CREATININE 1.14 03/19/2023   BUN 24 (H) 03/19/2023   CO2 30 03/19/2023   TSH 2.43 03/19/2023   PSA 1.88 03/19/2023    DG Cervical Spine Complete Result Date: 05/26/2018 CLINICAL DATA:  Neck pain with decreased range of motion for 2 days, no known injury, initial encounter EXAM: CERVICAL SPINE - COMPLETE 4+ VIEW COMPARISON:  04/12/2009 FINDINGS: There again noted changes consistent with fusion extending from C3-C7 with anterior fixation. Osteophytic changes at C2-3 are noted. No prevertebral soft tissue changes are noted. No hardware failure is seen. The neural foramina show narrowing at 6 7 bilaterally. No soft tissue abnormality is seen. The odontoid is within normal limits. IMPRESSION: Postsurgical and degenerative changes without acute abnormality Electronically Signed   By: Violeta Grey M.D.   On: 05/26/2018 15:25    Assessment & Plan:   Problem List Items Addressed This Visit     Essential hypertension   On Losartan       Relevant Medications   losartan  (COZAAR ) 25 MG tablet   Other Relevant Orders   Comprehensive metabolic panel with GFR   Low back pain   Doing well      Shoulder pain - Primary   CBD oil/oral Infrared heating pad for shoulders      Chronic renal insufficiency, stage 2 (mild)   GFR 49- 56; stage IIIa Continue with good hydration         Meds ordered this encounter  Medications   diazepam  (VALIUM ) 5 MG tablet    Sig: Take 1 tablet (5 mg total) by mouth at bedtime as needed.    Dispense:  90 tablet    Refill:  1   losartan  (COZAAR )  25 MG tablet    Sig: Take 2 tablets (50 mg total) by mouth daily.    Dispense:  180 tablet    Refill:  3      Follow-up: Return in about 6 months (around 03/18/2024) for  Wellness Exam.  Anitra Barn, MD

## 2023-09-18 ENCOUNTER — Ambulatory Visit: Payer: Self-pay | Admitting: Internal Medicine

## 2023-10-29 DIAGNOSIS — K080 Exfoliation of teeth due to systemic causes: Secondary | ICD-10-CM | POA: Diagnosis not present

## 2023-12-16 DIAGNOSIS — M19212 Secondary osteoarthritis, left shoulder: Secondary | ICD-10-CM | POA: Diagnosis not present

## 2023-12-19 ENCOUNTER — Encounter: Payer: Self-pay | Admitting: Internal Medicine

## 2023-12-19 ENCOUNTER — Ambulatory Visit

## 2023-12-20 ENCOUNTER — Ambulatory Visit

## 2023-12-20 DIAGNOSIS — Z23 Encounter for immunization: Secondary | ICD-10-CM

## 2023-12-20 NOTE — Progress Notes (Cosign Needed Addendum)
 Flu vaccine was given to patient and there were no complications   Medical screening examination/treatment/procedure(s) were performed by non-physician practitioner and as supervising physician I was immediately available for consultation/collaboration.  I agree with above. Karlynn Noel, MD

## 2023-12-27 DIAGNOSIS — M19212 Secondary osteoarthritis, left shoulder: Secondary | ICD-10-CM | POA: Diagnosis not present

## 2024-01-14 DIAGNOSIS — M25812 Other specified joint disorders, left shoulder: Secondary | ICD-10-CM | POA: Diagnosis not present

## 2024-01-14 DIAGNOSIS — G8918 Other acute postprocedural pain: Secondary | ICD-10-CM | POA: Diagnosis not present

## 2024-01-14 DIAGNOSIS — M19012 Primary osteoarthritis, left shoulder: Secondary | ICD-10-CM | POA: Diagnosis not present

## 2024-01-31 DIAGNOSIS — M25612 Stiffness of left shoulder, not elsewhere classified: Secondary | ICD-10-CM | POA: Diagnosis not present

## 2024-01-31 DIAGNOSIS — Z96612 Presence of left artificial shoulder joint: Secondary | ICD-10-CM | POA: Diagnosis not present

## 2024-02-03 DIAGNOSIS — M25612 Stiffness of left shoulder, not elsewhere classified: Secondary | ICD-10-CM | POA: Diagnosis not present

## 2024-02-03 DIAGNOSIS — Z96612 Presence of left artificial shoulder joint: Secondary | ICD-10-CM | POA: Diagnosis not present

## 2024-02-06 DIAGNOSIS — Z96612 Presence of left artificial shoulder joint: Secondary | ICD-10-CM | POA: Diagnosis not present

## 2024-02-06 DIAGNOSIS — M25612 Stiffness of left shoulder, not elsewhere classified: Secondary | ICD-10-CM | POA: Diagnosis not present

## 2024-02-10 DIAGNOSIS — Z96612 Presence of left artificial shoulder joint: Secondary | ICD-10-CM | POA: Diagnosis not present

## 2024-02-10 DIAGNOSIS — M25612 Stiffness of left shoulder, not elsewhere classified: Secondary | ICD-10-CM | POA: Diagnosis not present

## 2024-02-13 DIAGNOSIS — M25612 Stiffness of left shoulder, not elsewhere classified: Secondary | ICD-10-CM | POA: Diagnosis not present

## 2024-02-13 DIAGNOSIS — Z96612 Presence of left artificial shoulder joint: Secondary | ICD-10-CM | POA: Diagnosis not present

## 2024-02-17 DIAGNOSIS — M25612 Stiffness of left shoulder, not elsewhere classified: Secondary | ICD-10-CM | POA: Diagnosis not present

## 2024-02-17 DIAGNOSIS — Z96612 Presence of left artificial shoulder joint: Secondary | ICD-10-CM | POA: Diagnosis not present

## 2024-03-03 DIAGNOSIS — M25612 Stiffness of left shoulder, not elsewhere classified: Secondary | ICD-10-CM | POA: Diagnosis not present

## 2024-03-03 DIAGNOSIS — Z96612 Presence of left artificial shoulder joint: Secondary | ICD-10-CM | POA: Diagnosis not present

## 2024-03-05 ENCOUNTER — Ambulatory Visit

## 2024-03-05 VITALS — Ht 69.0 in | Wt 185.0 lb

## 2024-03-05 DIAGNOSIS — Z0000 Encounter for general adult medical examination without abnormal findings: Secondary | ICD-10-CM

## 2024-03-05 NOTE — Progress Notes (Cosign Needed Addendum)
 Chief Complaint  Patient presents with   Medicare Wellness     Subjective:   [name] is a 88 y.o. male who presents for a Medicare Annual Wellness Visit.  Visit info / Clinical Intake: Medicare Wellness Visit Type:: Subsequent Annual Wellness Visit Persons participating in visit and providing information:: patient Medicare Wellness Visit Mode:: Telephone If telephone:: video declined Since this visit was completed virtually, some vitals may be partially provided or unavailable. Missing vitals are due to the limitations of the virtual format.: Documented vitals are patient reported If Telephone or Video please confirm:: I connected with patient using audio/video enable telemedicine. I verified patient identity with two identifiers, discussed telehealth limitations, and patient agreed to proceed. Patient Location:: home Provider Location:: home office Interpreter Needed?: No Pre-visit prep was completed: yes AWV questionnaire completed by patient prior to visit?: yes Date:: 03/04/24 Living arrangements:: (Patient-Rptd) lives with spouse/significant other Patient's Overall Health Status Rating: (Patient-Rptd) excellent Typical amount of pain: (Patient-Rptd) none Does pain affect daily life?: (Patient-Rptd) no Are you currently prescribed opioids?: no  Dietary Habits and Nutritional Risks How many meals a day?: (Patient-Rptd) 3 Eats fruit and vegetables daily?: (Patient-Rptd) yes Most meals are obtained by: (Patient-Rptd) having others provide food In the last 2 weeks, have you had any of the following?: none Diabetic:: no  Functional Status Activities of Daily Living (to include ambulation/medication): (Patient-Rptd) Independent Ambulation: (Patient-Rptd) Independent Medication Administration: (Patient-Rptd) Independent Home Management (perform basic housework or laundry): (Patient-Rptd) Independent Manage your own finances?: (Patient-Rptd) yes Primary transportation  is: (Patient-Rptd) driving Concerns about vision?: no *vision screening is required for WTM* Concerns about hearing?: no (followed by audiology)  Fall Screening Falls in the past year?: (Patient-Rptd) 0 Number of falls in past year: 0 Was there an injury with Fall?: 0 Fall Risk Category Calculator: 0 Patient Fall Risk Level: Low Fall Risk  Fall Risk Patient at Risk for Falls Due to: No Fall Risks Fall risk Follow up: Education provided; Falls evaluation completed; Falls prevention discussed  Home and Transportation Safety: All rugs have non-skid backing?: (Patient-Rptd) yes All stairs or steps have railings?: (Patient-Rptd) N/A, no stairs Grab bars in the bathtub or shower?: (Patient-Rptd) yes Have non-skid surface in bathtub or shower?: (Patient-Rptd) yes Good home lighting?: (Patient-Rptd) yes Regular seat belt use?: (Patient-Rptd) yes Hospital stays in the last year:: (Patient-Rptd) no  Cognitive Assessment Difficulty concentrating, remembering, or making decisions? : (Patient-Rptd) no Will 6CIT or Mini Cog be Completed: no 6CIT or Mini Cog Declined: patient alert, oriented, able to answer questions appropriately and recall recent events  Advance Directives (For Healthcare) Does Patient Have a Medical Advance Directive?: Yes Does patient want to make changes to medical advance directive?: No - Patient declined Type of Advance Directive: Healthcare Power of Attorney Copy of Healthcare Power of Attorney in Chart?: No - copy requested  Reviewed/Updated  Reviewed/Updated: Reviewed All (Medical, Surgical, Family, Medications, Allergies, Care Teams, Patient Goals)    Allergies (verified) Amoxicillin   Current Medications (verified) Outpatient Encounter Medications as of 03/05/2024  Medication Sig   diazepam  (VALIUM ) 5 MG tablet Take 1 tablet (5 mg total) by mouth at bedtime as needed.   losartan  (COZAAR ) 25 MG tablet Take 2 tablets (50 mg total) by mouth daily.   Magnesium  400 MG CAPS Take by mouth.   Multiple Vitamin (MULTIVITAMIN) capsule Take 2 capsules by mouth daily.    Omega-3 1000 MG CAPS Take 1 capsule by mouth daily.   vitamin C  (ASCORBIC ACID )  500 MG tablet Take 500 mg by mouth daily.   [DISCONTINUED] diphenhydrAMINE  (BENADRYL ) 25 MG tablet Take 1 tablet by mouth 3 (three) times daily as needed. 2 by mouth at bedtime    No facility-administered encounter medications on file as of 03/05/2024.    History: Past Medical History:  Diagnosis Date   Allergy    CHOLELITHIASIS 12/17/2007   COLONIC POLYPS, ADENOMATOUS, HX OF 12/17/2007   DEGENERATIVE DISC DISEASE, CERVICAL SPINE 02/19/2008   DISC DISEASE, LUMBAR 12/17/2007   HYPERTENSION 12/17/2007   INTERNAL HEMORRHOIDS 12/17/2007   Overweight(278.02) 02/19/2008   POLIOMYELITIS, HX OF 12/17/2007   RENAL CYST 12/17/2007   SHINGLES, HX OF 12/17/2007   Past Surgical History:  Procedure Laterality Date   Bilateral knee surgery     arthroscopy bilateral - Dr. Jane   CERVICAL FUSION     HEMORRHOID SURGERY     lumbar back surgery     two procedures: diskectomy and then a fusion L4-5, L5-S1 Dr. Alix   TONSILLECTOMY     Family History  Problem Relation Age of Onset   Cancer Neg Hx    Diabetes Neg Hx    Heart disease Neg Hx    Hyperlipidemia Neg Hx    Social History   Occupational History   Occupation: Secretary/administrator supplies and games developer.  Tobacco Use   Smoking status: Former    Current packs/day: 0.00    Types: Cigarettes    Quit date: 05/03/2005    Years since quitting: 18.8   Smokeless tobacco: Never  Substance and Sexual Activity   Alcohol use: Yes    Comment: rare drinker   Drug use: Never   Sexual activity: Not Currently    Partners: Female   Tobacco Counseling Counseling given: Not Answered  SDOH Screenings   Food Insecurity: No Food Insecurity (03/04/2024)  Housing: Low Risk  (03/04/2024)  Transportation Needs: No Transportation Needs  (03/04/2024)  Utilities: Not At Risk (03/05/2024)  Alcohol Screen: Low Risk  (11/06/2021)  Depression (PHQ2-9): Low Risk  (03/05/2024)  Financial Resource Strain: Low Risk  (03/04/2024)  Physical Activity: Sufficiently Active (03/04/2024)  Social Connections: Moderately Integrated (03/04/2024)  Stress: No Stress Concern Present (03/04/2024)  Tobacco Use: Medium Risk (03/05/2024)  Health Literacy: Adequate Health Literacy (03/05/2024)   See flowsheets for full screening details  Depression Screen PHQ 2 & 9 Depression Scale- Over the past 2 weeks, how often have you been bothered by any of the following problems? Little interest or pleasure in doing things: 0 Feeling down, depressed, or hopeless (PHQ Adolescent also includes...irritable): 0 PHQ-2 Total Score: 0     Goals Addressed               This Visit's Progress     Patient Stated (pt-stated)   On track     Patient would like to continue to stay healthy.             Objective:    Today's Vitals   03/05/24 1545  Weight: 185 lb (83.9 kg)  Height: 5' 9 (1.753 m)   Body mass index is 27.32 kg/m.  Hearing/Vision screen Hearing Screening - Comments:: Followed by audiology; Patient is able to hear conversational tones without difficulty. No issues reported.   Vision Screening - Comments:: Wears rx glasses - up to date with routine eye exams with Dr. Patrcia  Immunizations and Health Maintenance Health Maintenance  Topic Date Due   COVID-19 Vaccine (6 - 2025-26 season) 12/02/2023   Medicare  Annual Wellness (AWV)  03/05/2025   DTaP/Tdap/Td (3 - Td or Tdap) 10/05/2026   Pneumococcal Vaccine: 50+ Years  Completed   Influenza Vaccine  Completed   Meningococcal B Vaccine  Aged Out   Zoster Vaccines- Shingrix  Discontinued        Assessment/Plan:  This is a routine wellness examination for [name].  Patient Care Team: Plotnikov, Karlynn GAILS, MD as PCP - General (Internal Medicine) Alix Charleston, MD as Consulting Physician  (Neurosurgery) Dwane Lerner, MD as Consulting Physician (Dermatology) Donzella Moats, AUD (Audiology) Dozier Soulier, MD as Consulting Physician (Orthopedic Surgery) Patrcia Sharper, MD as Consulting Physician (Ophthalmology)  I have personally reviewed and noted the following in the patient's chart:   Medical and social history Use of alcohol, tobacco or illicit drugs  Current medications and supplements including opioid prescriptions. Functional ability and status Nutritional status Physical activity Advanced directives List of other physicians Hospitalizations, surgeries, and ER visits in previous 12 months Vitals Screenings to include cognitive, depression, and falls Referrals and appointments  No orders of the defined types were placed in this encounter.  In addition, I have reviewed and discussed with patient certain preventive protocols, quality metrics, and best practice recommendations. A written personalized care plan for preventive services as well as general preventive health recommendations were provided to patient.   Adam Charmaine Browner, LPN   [id number]   Return in 1 year (on 03/05/2025).  After Visit Summary: (MyChart) Due to this being a telephonic visit, the after visit summary with patients personalized plan was offered to patient via MyChart   Nurse Notes: No concerns at this time   Medical screening examination/treatment/procedure(s) were performed by non-physician practitioner and as supervising physician I was immediately available for consultation/collaboration.  I agree with above. Karlynn Noel, MD

## 2024-03-05 NOTE — Patient Instructions (Signed)
 Mr. Nauta,  Thank you for taking the time for your Medicare Wellness Visit. I appreciate your continued commitment to your health goals. Please review the care plan we discussed, and feel free to reach out if I can assist you further.  Please note that Annual Wellness Visits do not include a physical exam. Some assessments may be limited, especially if the visit was conducted virtually. If needed, we may recommend an in-person follow-up with your provider.  Ongoing Care Seeing your primary care provider every 3 to 6 months helps us  monitor your health and provide consistent, personalized care.   Referrals If a referral was made during today's visit and you haven't received any updates within two weeks, please contact the referred provider directly to check on the status.  Recommended Screenings:  Health Maintenance  Topic Date Due   COVID-19 Vaccine (6 - 2025-26 season) 12/02/2023   Medicare Annual Wellness Visit  03/05/2025   DTaP/Tdap/Td vaccine (3 - Td or Tdap) 10/05/2026   Pneumococcal Vaccine for age over 41  Completed   Flu Shot  Completed   Meningitis B Vaccine  Aged Out   Zoster (Shingles) Vaccine  Discontinued       03/04/2024    4:47 PM  Advanced Directives  Does Patient Have a Medical Advance Directive? Yes  Type of Advance Directive Healthcare Power of Attorney  Does patient want to make changes to medical advance directive? No - Patient declined  Copy of Healthcare Power of Attorney in Chart? No - copy requested   Information on Advanced Care Planning can be found at [place]  [employer] [facility] Advance Health Care Directives Advance Health Care Directives ([email or web address]    Vision: Annual vision screenings are recommended for early detection of glaucoma, cataracts, and diabetic retinopathy. These exams can also reveal signs of chronic conditions such as diabetes and high blood pressure.  Dental: Annual dental screenings help detect early signs of oral cancer,  gum disease, and other conditions linked to overall health, including heart disease and diabetes.  Please see the attached documents for additional preventive care recommendations.

## 2024-03-06 DIAGNOSIS — M25612 Stiffness of left shoulder, not elsewhere classified: Secondary | ICD-10-CM | POA: Diagnosis not present

## 2024-03-06 DIAGNOSIS — Z96612 Presence of left artificial shoulder joint: Secondary | ICD-10-CM | POA: Diagnosis not present

## 2024-03-10 DIAGNOSIS — Z96612 Presence of left artificial shoulder joint: Secondary | ICD-10-CM | POA: Diagnosis not present

## 2024-03-10 DIAGNOSIS — M25612 Stiffness of left shoulder, not elsewhere classified: Secondary | ICD-10-CM | POA: Diagnosis not present

## 2024-03-12 DIAGNOSIS — Z96612 Presence of left artificial shoulder joint: Secondary | ICD-10-CM | POA: Diagnosis not present

## 2024-03-12 DIAGNOSIS — M25612 Stiffness of left shoulder, not elsewhere classified: Secondary | ICD-10-CM | POA: Diagnosis not present

## 2024-03-18 ENCOUNTER — Encounter: Payer: Self-pay | Admitting: Internal Medicine

## 2024-03-18 ENCOUNTER — Ambulatory Visit: Admitting: Internal Medicine

## 2024-03-18 VITALS — BP 152/96 | HR 66 | Temp 98.3°F | Ht 69.0 in | Wt 190.4 lb

## 2024-03-18 DIAGNOSIS — M1990 Unspecified osteoarthritis, unspecified site: Secondary | ICD-10-CM

## 2024-03-18 DIAGNOSIS — Z125 Encounter for screening for malignant neoplasm of prostate: Secondary | ICD-10-CM

## 2024-03-18 DIAGNOSIS — F5101 Primary insomnia: Secondary | ICD-10-CM

## 2024-03-18 DIAGNOSIS — N182 Chronic kidney disease, stage 2 (mild): Secondary | ICD-10-CM | POA: Diagnosis not present

## 2024-03-18 DIAGNOSIS — N2889 Other specified disorders of kidney and ureter: Secondary | ICD-10-CM

## 2024-03-18 DIAGNOSIS — N320 Bladder-neck obstruction: Secondary | ICD-10-CM

## 2024-03-18 DIAGNOSIS — I10 Essential (primary) hypertension: Secondary | ICD-10-CM

## 2024-03-18 DIAGNOSIS — Z0000 Encounter for general adult medical examination without abnormal findings: Secondary | ICD-10-CM

## 2024-03-18 LAB — URINALYSIS
Bilirubin Urine: NEGATIVE
Hgb urine dipstick: NEGATIVE
Ketones, ur: NEGATIVE
Leukocytes,Ua: NEGATIVE
Nitrite: NEGATIVE
Specific Gravity, Urine: 1.02 (ref 1.000–1.030)
Total Protein, Urine: NEGATIVE
Urine Glucose: NEGATIVE
Urobilinogen, UA: 0.2 (ref 0.0–1.0)
pH: 6 (ref 5.0–8.0)

## 2024-03-18 LAB — CBC WITH DIFFERENTIAL/PLATELET
Basophils Absolute: 0 K/uL (ref 0.0–0.1)
Basophils Relative: 0.8 % (ref 0.0–3.0)
Eosinophils Absolute: 0.3 K/uL (ref 0.0–0.7)
Eosinophils Relative: 4.9 % (ref 0.0–5.0)
HCT: 41.2 % (ref 39.0–52.0)
Hemoglobin: 13.8 g/dL (ref 13.0–17.0)
Lymphocytes Relative: 27.5 % (ref 12.0–46.0)
Lymphs Abs: 1.5 K/uL (ref 0.7–4.0)
MCHC: 33.6 g/dL (ref 30.0–36.0)
MCV: 96.3 fl (ref 78.0–100.0)
Monocytes Absolute: 0.5 K/uL (ref 0.1–1.0)
Monocytes Relative: 9.8 % (ref 3.0–12.0)
Neutro Abs: 3.1 K/uL (ref 1.4–7.7)
Neutrophils Relative %: 57 % (ref 43.0–77.0)
Platelets: 205 K/uL (ref 150.0–400.0)
RBC: 4.28 Mil/uL (ref 4.22–5.81)
RDW: 14 % (ref 11.5–15.5)
WBC: 5.4 K/uL (ref 4.0–10.5)

## 2024-03-18 LAB — LIPID PANEL
Cholesterol: 171 mg/dL (ref 28–200)
HDL: 78.1 mg/dL (ref >39.00)
LDL Cholesterol: 82 mg/dL (ref 10–99)
NonHDL: 93.24
Total CHOL/HDL Ratio: 2
Triglycerides: 57 mg/dL (ref 10.0–149.0)
VLDL: 11.4 mg/dL (ref 0.0–40.0)

## 2024-03-18 LAB — COMPREHENSIVE METABOLIC PANEL WITH GFR
ALT: 15 U/L (ref 3–53)
AST: 21 U/L (ref 5–37)
Albumin: 3.9 g/dL (ref 3.5–5.2)
Alkaline Phosphatase: 82 U/L (ref 39–117)
BUN: 24 mg/dL — ABNORMAL HIGH (ref 6–23)
CO2: 31 meq/L (ref 19–32)
Calcium: 9.2 mg/dL (ref 8.4–10.5)
Chloride: 105 meq/L (ref 96–112)
Creatinine, Ser: 1.1 mg/dL (ref 0.40–1.50)
GFR: 60.72 mL/min (ref >60.00)
Glucose, Bld: 85 mg/dL (ref 70–99)
Potassium: 4 meq/L (ref 3.5–5.1)
Sodium: 142 meq/L (ref 135–145)
Total Bilirubin: 0.6 mg/dL (ref 0.2–1.2)
Total Protein: 6.3 g/dL (ref 6.0–8.3)

## 2024-03-18 LAB — TSH: TSH: 2.95 u[IU]/mL (ref 0.35–5.50)

## 2024-03-18 LAB — PSA: PSA: 1.32 ng/mL (ref 0.10–4.00)

## 2024-03-18 MED ORDER — DIAZEPAM 5 MG PO TABS: 5.0000 mg | ORAL_TABLET | Freq: Every evening | ORAL | 1 refills | Status: AC | PRN

## 2024-03-18 NOTE — Patient Instructions (Signed)

## 2024-03-18 NOTE — Progress Notes (Addendum)
 "  Subjective:  Patient ID: [name], male    DOB: 03-07-1936  Age: 88 y.o. MRN: [id number]  CC: Medical Management of Chronic Issues (6 Month Follow up)   HPI [name] presents for HTN, insomnia, OA  Outpatient Medications Prior to Visit  Medication Sig Dispense Refill   losartan  (COZAAR ) 25 MG tablet Take 2 tablets (50 mg total) by mouth daily. 180 tablet 3   Magnesium 400 MG CAPS Take by mouth.     Multiple Vitamin (MULTIVITAMIN) capsule Take 2 capsules by mouth daily.      Omega-3 1000 MG CAPS Take 1 capsule by mouth daily.     vitamin C  (ASCORBIC ACID ) 500 MG tablet Take 500 mg by mouth daily.     diazepam  (VALIUM ) 5 MG tablet Take 1 tablet (5 mg total) by mouth at bedtime as needed. 90 tablet 1   No facility-administered medications prior to visit.    ROS: Review of Systems  Constitutional:  Negative for appetite change, fatigue and unexpected weight change.  HENT:  Negative for congestion, nosebleeds, sneezing, sore throat and trouble swallowing.   Eyes:  Negative for itching and visual disturbance.  Respiratory:  Negative for cough.   Cardiovascular:  Negative for chest pain, palpitations and leg swelling.  Gastrointestinal:  Negative for abdominal distention, blood in stool, diarrhea and nausea.  Genitourinary:  Negative for frequency and hematuria.  Musculoskeletal:  Negative for back pain, gait problem, joint swelling and neck pain.  Skin:  Negative for rash.  Neurological:  Negative for dizziness, tremors, speech difficulty and weakness.  Psychiatric/Behavioral:  Negative for agitation, dysphoric mood and sleep disturbance. The patient is not nervous/anxious.     Objective:  BP (!) 152/96   Pulse 66   Temp 98.3 F (36.8 C)   Ht 5' 9 (1.753 m)   Wt 190 lb 6.4 oz (86.4 kg)   SpO2 99%   BMI 28.12 kg/m   BP Readings from Last 3 Encounters:  03/18/24 (!) 152/96  09/17/23 118/68  07/30/23 120/88    Wt Readings from Last 3 Encounters:  03/18/24  190 lb 6.4 oz (86.4 kg)  03/05/24 185 lb (83.9 kg)  09/17/23 185 lb (83.9 kg)    Physical Exam Constitutional:      General: He is not in acute distress.    Appearance: Normal appearance. He is well-developed.     Comments: NAD  Eyes:     Conjunctiva/sclera: Conjunctivae normal.     Pupils: Pupils are equal, round, and reactive to light.  Neck:     Thyroid : No thyromegaly.     Vascular: No JVD.  Cardiovascular:     Rate and Rhythm: Normal rate and regular rhythm.     Heart sounds: Normal heart sounds. No murmur heard.    No friction rub. No gallop.  Pulmonary:     Effort: Pulmonary effort is normal. No respiratory distress.     Breath sounds: Normal breath sounds. No wheezing or rales.  Chest:     Chest wall: No tenderness.  Abdominal:     General: Bowel sounds are normal. There is no distension.     Palpations: Abdomen is soft. There is no mass.     Tenderness: There is no abdominal tenderness. There is no guarding or rebound.  Musculoskeletal:        General: No tenderness. Normal range of motion.     Cervical back: Normal range of motion.     Right lower leg: No  edema.     Left lower leg: No edema.  Lymphadenopathy:     Cervical: No cervical adenopathy.  Skin:    General: Skin is warm and dry.     Findings: No rash.  Neurological:     Mental Status: He is alert and oriented to person, place, and time.     Cranial Nerves: No cranial nerve deficit.     Motor: No abnormal muscle tone.     Coordination: Coordination normal.     Gait: Gait normal.     Deep Tendon Reflexes: Reflexes are normal and symmetric.  Psychiatric:        Behavior: Behavior normal.        Thought Content: Thought content normal.        Judgment: Judgment normal.     Lab Results  Component Value Date   WBC 5.4 03/18/2024   HGB 13.8 03/18/2024   HCT 41.2 03/18/2024   PLT 205.0 03/18/2024   GLUCOSE 85 03/18/2024   CHOL 171 03/18/2024   TRIG 57.0 03/18/2024   HDL 78.10 03/18/2024    LDLDIRECT 119.5 09/24/2006   LDLCALC 82 03/18/2024   ALT 15 03/18/2024   AST 21 03/18/2024   NA 142 03/18/2024   K 4.0 03/18/2024   CL 105 03/18/2024   CREATININE 1.10 03/18/2024   BUN 24 (H) 03/18/2024   CO2 31 03/18/2024   TSH 2.95 03/18/2024   PSA 1.32 03/18/2024    DG Cervical Spine Complete Result Date: 05/26/2018 CLINICAL DATA:  Neck pain with decreased range of motion for 2 days, no known injury, initial encounter EXAM: CERVICAL SPINE - COMPLETE 4+ VIEW COMPARISON:  04/12/2009 FINDINGS: There again noted changes consistent with fusion extending from C3-C7 with anterior fixation. Osteophytic changes at C2-3 are noted. No prevertebral soft tissue changes are noted. No hardware failure is seen. The neural foramina show narrowing at 6 7 bilaterally. No soft tissue abnormality is seen. The odontoid is within normal limits. IMPRESSION: Postsurgical and degenerative changes without acute abnormality Electronically Signed   By: Oneil Devonshire M.D.   On: 05/26/2018 15:25    Assessment & Plan:   Problem List Items Addressed This Visit     Bladder neck obstruction   Check PSA      Relevant Orders   TSH (Completed)   Urinalysis (Completed)   CBC with Differential/Platelet (Completed)   Lipid panel (Completed)   PSA (Completed)   Comprehensive metabolic panel with GFR (Completed)   Chronic renal insufficiency, stage 2 (mild)   Relevant Orders   TSH (Completed)   Urinalysis (Completed)   CBC with Differential/Platelet (Completed)   Lipid panel (Completed)   PSA (Completed)   Comprehensive metabolic panel with GFR (Completed)   Essential hypertension - Primary   On Losartan  Blood pressure is well-controlled      Relevant Orders   TSH (Completed)   Urinalysis (Completed)   CBC with Differential/Platelet (Completed)   Lipid panel (Completed)   PSA (Completed)   Comprehensive metabolic panel with GFR (Completed)   Insomnia   On Diazepam  prn  Potential benefits of a long term  benzodiazepines  use as well as potential risks  and complications were explained to the patient and were aknowledged.      Osteoarthritis   Shoulder pain is much better after surgery      Well adult exam   Relevant Orders   TSH (Completed)   Urinalysis (Completed)   CBC with Differential/Platelet (Completed)   Lipid panel (Completed)  PSA (Completed)   Comprehensive metabolic panel with GFR (Completed)      Meds ordered this encounter  Medications   diazepam  (VALIUM ) 5 MG tablet    Sig: Take 1 tablet (5 mg total) by mouth at bedtime as needed.    Dispense:  90 tablet    Refill:  1      Follow-up: Return in about 6 months (around 09/16/2024) for a follow-up visit.  Marolyn Noel, MD "

## 2024-03-19 ENCOUNTER — Ambulatory Visit: Payer: Self-pay | Admitting: Internal Medicine

## 2024-03-25 DIAGNOSIS — N320 Bladder-neck obstruction: Secondary | ICD-10-CM | POA: Insufficient documentation

## 2024-03-25 DIAGNOSIS — M1990 Unspecified osteoarthritis, unspecified site: Secondary | ICD-10-CM | POA: Insufficient documentation

## 2024-03-25 NOTE — Assessment & Plan Note (Signed)
On Diazepam prn  Potential benefits of a long term benzodiazepines  use as well as potential risks  and complications were explained to the patient and were aknowledged.  

## 2024-03-25 NOTE — Assessment & Plan Note (Signed)
 Shoulder pain is much better after surgery

## 2024-03-25 NOTE — Assessment & Plan Note (Signed)
 On Losartan  Blood pressure is well-controlled

## 2024-03-25 NOTE — Assessment & Plan Note (Signed)
 Check PSA. ?

## 2024-03-25 NOTE — Addendum Note (Signed)
 Addended by: Dontavius Keim V on: 03/25/2024 05:47 PM   Modules accepted: Level of Service
